# Patient Record
Sex: Female | Born: 1980 | Race: White | Hispanic: Yes | Marital: Married | Smoking: Never smoker
Health system: Southern US, Community
[De-identification: ages and names within clinical notes are randomized; demographics above are authoritative.]

## PROBLEM LIST (undated history)

## (undated) ENCOUNTER — Emergency Department (HOSPITAL_COMMUNITY): Discharge status: Absent without leave | Payer: Self-pay

## (undated) ENCOUNTER — Inpatient Hospital Stay (HOSPITAL_COMMUNITY): Payer: Self-pay

## (undated) DIAGNOSIS — L309 Dermatitis, unspecified: Secondary | ICD-10-CM

## (undated) DIAGNOSIS — K219 Gastro-esophageal reflux disease without esophagitis: Secondary | ICD-10-CM

## (undated) HISTORY — PX: NO PAST SURGERIES: SHX2092

## (undated) HISTORY — DX: Gastro-esophageal reflux disease without esophagitis: K21.9

---

## 2002-07-22 ENCOUNTER — Inpatient Hospital Stay (HOSPITAL_COMMUNITY): Admission: AD | Admit: 2002-07-22 | Discharge: 2002-07-24 | Payer: Self-pay | Admitting: Obstetrics & Gynecology

## 2003-03-01 ENCOUNTER — Emergency Department (HOSPITAL_COMMUNITY): Admission: EM | Admit: 2003-03-01 | Discharge: 2003-03-01 | Payer: Self-pay | Admitting: Emergency Medicine

## 2003-10-21 ENCOUNTER — Ambulatory Visit: Payer: Self-pay | Admitting: Family Medicine

## 2003-12-30 ENCOUNTER — Ambulatory Visit: Payer: Self-pay | Admitting: Family Medicine

## 2004-11-01 ENCOUNTER — Encounter (INDEPENDENT_AMBULATORY_CARE_PROVIDER_SITE_OTHER): Payer: Self-pay | Admitting: *Deleted

## 2004-11-01 LAB — CONVERTED CEMR LAB

## 2004-11-16 ENCOUNTER — Ambulatory Visit: Payer: Self-pay | Admitting: Family Medicine

## 2004-11-17 ENCOUNTER — Ambulatory Visit (HOSPITAL_COMMUNITY): Admission: RE | Admit: 2004-11-17 | Discharge: 2004-11-17 | Payer: Self-pay | Admitting: Family Medicine

## 2004-11-23 ENCOUNTER — Ambulatory Visit: Payer: Self-pay | Admitting: Family Medicine

## 2005-02-08 ENCOUNTER — Ambulatory Visit: Payer: Self-pay | Admitting: Family Medicine

## 2005-02-19 ENCOUNTER — Ambulatory Visit: Payer: Self-pay | Admitting: Family Medicine

## 2005-04-02 ENCOUNTER — Encounter (INDEPENDENT_AMBULATORY_CARE_PROVIDER_SITE_OTHER): Payer: Self-pay | Admitting: Specialist

## 2005-04-02 ENCOUNTER — Ambulatory Visit (HOSPITAL_BASED_OUTPATIENT_CLINIC_OR_DEPARTMENT_OTHER): Admission: RE | Admit: 2005-04-02 | Discharge: 2005-04-02 | Payer: Self-pay | Admitting: Otolaryngology

## 2005-08-02 ENCOUNTER — Ambulatory Visit: Payer: Self-pay | Admitting: Family Medicine

## 2005-09-06 ENCOUNTER — Ambulatory Visit: Payer: Self-pay | Admitting: Sports Medicine

## 2005-11-08 ENCOUNTER — Ambulatory Visit: Payer: Self-pay | Admitting: Family Medicine

## 2005-12-09 ENCOUNTER — Ambulatory Visit: Payer: Self-pay | Admitting: Family Medicine

## 2006-03-31 DIAGNOSIS — K649 Unspecified hemorrhoids: Secondary | ICD-10-CM | POA: Insufficient documentation

## 2006-04-01 ENCOUNTER — Encounter (INDEPENDENT_AMBULATORY_CARE_PROVIDER_SITE_OTHER): Payer: Self-pay | Admitting: *Deleted

## 2006-06-20 ENCOUNTER — Ambulatory Visit: Payer: Self-pay | Admitting: Family Medicine

## 2006-06-20 ENCOUNTER — Encounter: Payer: Self-pay | Admitting: Family Medicine

## 2006-06-20 LAB — CONVERTED CEMR LAB: Pap Smear: NORMAL

## 2006-06-23 ENCOUNTER — Encounter: Payer: Self-pay | Admitting: Family Medicine

## 2006-06-24 ENCOUNTER — Encounter: Payer: Self-pay | Admitting: Family Medicine

## 2006-09-05 ENCOUNTER — Ambulatory Visit: Payer: Self-pay | Admitting: Sports Medicine

## 2006-09-05 LAB — CONVERTED CEMR LAB: Beta hcg, urine, semiquantitative: POSITIVE

## 2006-09-20 ENCOUNTER — Telehealth: Payer: Self-pay | Admitting: *Deleted

## 2006-09-21 ENCOUNTER — Encounter (INDEPENDENT_AMBULATORY_CARE_PROVIDER_SITE_OTHER): Payer: Self-pay | Admitting: Family Medicine

## 2006-09-21 ENCOUNTER — Ambulatory Visit: Payer: Self-pay | Admitting: Family Medicine

## 2006-09-21 LAB — CONVERTED CEMR LAB
Antibody Screen: NEGATIVE
Basophils Absolute: 0 10*3/uL (ref 0.0–0.1)
Basophils Relative: 0 % (ref 0–1)
Bilirubin Urine: NEGATIVE
Blood in Urine, dipstick: NEGATIVE
Chlamydia, DNA Probe: NEGATIVE
Eosinophils Absolute: 0.1 10*3/uL (ref 0.0–0.7)
Eosinophils Relative: 1 % (ref 0–5)
GC Culture Only: NEGATIVE
GC Probe Amp, Genital: NEGATIVE
Glucose, Urine, Semiquant: NEGATIVE
HCT: 37.5 % (ref 36.0–46.0)
Hemoglobin: 13 g/dL (ref 12.0–15.0)
Hepatitis B Surface Ag: NEGATIVE
Ketones, urine, test strip: NEGATIVE
Lymphocytes Relative: 29 % (ref 12–46)
Lymphs Abs: 2.6 10*3/uL (ref 0.7–3.3)
MCHC: 34.7 g/dL (ref 30.0–36.0)
MCV: 85.4 fL (ref 78.0–100.0)
Monocytes Absolute: 0.4 10*3/uL (ref 0.2–0.7)
Monocytes Relative: 5 % (ref 3–11)
Neutro Abs: 5.9 10*3/uL (ref 1.7–7.7)
Neutrophils Relative %: 65 % (ref 43–77)
Nitrite: NEGATIVE
Platelets: 224 10*3/uL (ref 150–400)
Protein, U semiquant: NEGATIVE
RBC: 4.39 M/uL (ref 3.87–5.11)
RDW: 13 % (ref 11.5–14.0)
Rh Type: POSITIVE
Rubella antibody, serum, titer: IMMUNE
Rubella: 112.4 intl units/mL — ABNORMAL HIGH
Specific Gravity, Urine: 1.02
Urine culture (CF units/mL): NEGATIVE CFunits/mL
Urobilinogen, UA: 0.2
WBC: 9.1 10*3/uL (ref 4.0–10.5)
Whiff Test: NEGATIVE
pH: 8

## 2006-09-23 ENCOUNTER — Inpatient Hospital Stay (HOSPITAL_COMMUNITY): Admission: AD | Admit: 2006-09-23 | Discharge: 2006-09-23 | Payer: Self-pay | Admitting: Obstetrics & Gynecology

## 2006-09-26 ENCOUNTER — Ambulatory Visit: Payer: Self-pay | Admitting: Family Medicine

## 2006-10-11 ENCOUNTER — Ambulatory Visit: Payer: Self-pay | Admitting: Family Medicine

## 2006-10-11 ENCOUNTER — Encounter: Payer: Self-pay | Admitting: Family Medicine

## 2006-10-11 ENCOUNTER — Encounter (INDEPENDENT_AMBULATORY_CARE_PROVIDER_SITE_OTHER): Payer: Self-pay | Admitting: Family Medicine

## 2006-10-11 LAB — CONVERTED CEMR LAB
Bilirubin Urine: NEGATIVE
Glucose, Urine, Semiquant: NEGATIVE
KOH Prep: NEGATIVE
Ketones, urine, test strip: NEGATIVE
Nitrite: NEGATIVE
Protein, U semiquant: NEGATIVE
Specific Gravity, Urine: 1.02
Urobilinogen, UA: 0.2
WBC Urine, dipstick: NEGATIVE
Whiff Test: NEGATIVE
pH: 7

## 2006-10-12 ENCOUNTER — Encounter (INDEPENDENT_AMBULATORY_CARE_PROVIDER_SITE_OTHER): Payer: Self-pay | Admitting: Family Medicine

## 2006-11-09 ENCOUNTER — Ambulatory Visit: Payer: Self-pay | Admitting: Family Medicine

## 2006-11-09 LAB — CONVERTED CEMR LAB
Glucose, Urine, Semiquant: NEGATIVE
Protein, U semiquant: NEGATIVE

## 2006-12-14 ENCOUNTER — Ambulatory Visit (HOSPITAL_COMMUNITY): Admission: RE | Admit: 2006-12-14 | Discharge: 2006-12-14 | Payer: Self-pay | Admitting: Family Medicine

## 2006-12-15 ENCOUNTER — Ambulatory Visit: Payer: Self-pay | Admitting: Family Medicine

## 2006-12-15 LAB — CONVERTED CEMR LAB
Glucose, Urine, Semiquant: NEGATIVE
Protein, U semiquant: NEGATIVE
Whiff Test: NEGATIVE

## 2006-12-20 ENCOUNTER — Encounter (INDEPENDENT_AMBULATORY_CARE_PROVIDER_SITE_OTHER): Payer: Self-pay | Admitting: Family Medicine

## 2007-01-13 ENCOUNTER — Ambulatory Visit: Payer: Self-pay | Admitting: Family Medicine

## 2007-01-13 LAB — CONVERTED CEMR LAB
Glucose, Urine, Semiquant: NEGATIVE
Protein, U semiquant: NEGATIVE

## 2007-01-27 ENCOUNTER — Ambulatory Visit: Payer: Self-pay | Admitting: Family Medicine

## 2007-01-27 ENCOUNTER — Telehealth: Payer: Self-pay | Admitting: Family Medicine

## 2007-01-27 LAB — CONVERTED CEMR LAB
Glucose, Urine, Semiquant: NEGATIVE
Protein, U semiquant: NEGATIVE

## 2007-02-15 ENCOUNTER — Ambulatory Visit: Payer: Self-pay | Admitting: Family Medicine

## 2007-02-15 ENCOUNTER — Encounter: Payer: Self-pay | Admitting: *Deleted

## 2007-02-15 LAB — CONVERTED CEMR LAB
Glucose, Urine, Semiquant: NEGATIVE
Protein, U semiquant: NEGATIVE
Whiff Test: NEGATIVE

## 2007-02-16 ENCOUNTER — Encounter (INDEPENDENT_AMBULATORY_CARE_PROVIDER_SITE_OTHER): Payer: Self-pay | Admitting: *Deleted

## 2007-02-17 ENCOUNTER — Ambulatory Visit: Payer: Self-pay | Admitting: Family Medicine

## 2007-02-17 ENCOUNTER — Encounter (INDEPENDENT_AMBULATORY_CARE_PROVIDER_SITE_OTHER): Payer: Self-pay | Admitting: Family Medicine

## 2007-02-17 LAB — CONVERTED CEMR LAB
GTT, 1 hr: 109 mg/dL
GTT: NORMAL
Hemoglobin: 11.5 g/dL

## 2007-02-22 LAB — CONVERTED CEMR LAB

## 2007-02-27 ENCOUNTER — Ambulatory Visit: Payer: Self-pay | Admitting: Family Medicine

## 2007-02-27 LAB — CONVERTED CEMR LAB
Glucose, Urine, Semiquant: NEGATIVE
Protein, U semiquant: NEGATIVE

## 2007-03-17 ENCOUNTER — Ambulatory Visit: Payer: Self-pay | Admitting: Family Medicine

## 2007-03-17 LAB — CONVERTED CEMR LAB: Glucose, Urine, Semiquant: NEGATIVE

## 2007-03-31 ENCOUNTER — Ambulatory Visit: Payer: Self-pay | Admitting: Family Medicine

## 2007-03-31 LAB — CONVERTED CEMR LAB
Glucose, Urine, Semiquant: NEGATIVE
Protein, U semiquant: NEGATIVE

## 2007-04-17 ENCOUNTER — Encounter (INDEPENDENT_AMBULATORY_CARE_PROVIDER_SITE_OTHER): Payer: Self-pay | Admitting: Family Medicine

## 2007-04-17 ENCOUNTER — Ambulatory Visit: Payer: Self-pay | Admitting: Family Medicine

## 2007-04-17 LAB — CONVERTED CEMR LAB
Chlamydia, DNA Probe: NEGATIVE
GC Probe Amp, Genital: NEGATIVE
Glucose, Urine, Semiquant: NEGATIVE

## 2007-04-20 ENCOUNTER — Encounter: Payer: Self-pay | Admitting: *Deleted

## 2007-04-24 ENCOUNTER — Ambulatory Visit: Payer: Self-pay | Admitting: Sports Medicine

## 2007-04-24 LAB — CONVERTED CEMR LAB: Glucose, Urine, Semiquant: NEGATIVE

## 2007-05-02 ENCOUNTER — Ambulatory Visit: Payer: Self-pay | Admitting: Family Medicine

## 2007-05-02 ENCOUNTER — Encounter (INDEPENDENT_AMBULATORY_CARE_PROVIDER_SITE_OTHER): Payer: Self-pay | Admitting: Family Medicine

## 2007-05-02 LAB — CONVERTED CEMR LAB
ALT: 12 units/L (ref 0–35)
AST: 19 units/L (ref 0–37)
Albumin: 3.7 g/dL (ref 3.5–5.2)
Alkaline Phosphatase: 188 units/L — ABNORMAL HIGH (ref 39–117)
BUN: 8 mg/dL (ref 6–23)
Bilirubin Urine: NEGATIVE
CO2: 22 meq/L (ref 19–32)
Calcium: 9.3 mg/dL (ref 8.4–10.5)
Chloride: 103 meq/L (ref 96–112)
Creatinine, Ser: 0.47 mg/dL (ref 0.40–1.20)
Epithelial cells, urine: >20 /lpf
Glucose, Bld: 102 mg/dL — ABNORMAL HIGH (ref 70–99)
Glucose, Urine, Semiquant: NEGATIVE
HCT: 38.6 % (ref 36.0–46.0)
Hemoglobin: 13.3 g/dL (ref 12.0–15.0)
Ketones, urine, test strip: NEGATIVE
LDH: 147 units/L (ref 94–250)
MCHC: 34.5 g/dL (ref 30.0–36.0)
MCV: 88.7 fL (ref 78.0–100.0)
Nitrite: NEGATIVE
Platelets: 194 10*3/uL (ref 150–400)
Potassium: 4.2 meq/L (ref 3.5–5.3)
Protein, U semiquant: 100
RBC: 4.35 M/uL (ref 3.87–5.11)
RDW: 13.9 % (ref 11.5–15.5)
Sodium: 138 meq/L (ref 135–145)
Specific Gravity, Urine: 1.025
Total Bilirubin: 0.3 mg/dL (ref 0.3–1.2)
Total Protein: 6.7 g/dL (ref 6.0–8.3)
Uric Acid, Serum: 5.9 mg/dL (ref 2.4–7.0)
Urobilinogen, UA: 0.2
WBC Urine, dipstick: NEGATIVE
WBC: 7.8 10*3/uL (ref 4.0–10.5)
pH: 7

## 2007-05-03 ENCOUNTER — Ambulatory Visit: Payer: Self-pay | Admitting: Obstetrics and Gynecology

## 2007-05-03 ENCOUNTER — Encounter: Payer: Self-pay | Admitting: *Deleted

## 2007-05-03 ENCOUNTER — Inpatient Hospital Stay (HOSPITAL_COMMUNITY): Admission: AD | Admit: 2007-05-03 | Discharge: 2007-05-04 | Payer: Self-pay | Admitting: Obstetrics & Gynecology

## 2007-05-05 ENCOUNTER — Ambulatory Visit: Payer: Self-pay | Admitting: Family Medicine

## 2007-05-05 ENCOUNTER — Encounter (INDEPENDENT_AMBULATORY_CARE_PROVIDER_SITE_OTHER): Payer: Self-pay | Admitting: Family Medicine

## 2007-05-05 LAB — CONVERTED CEMR LAB: Protein, Ur: 616 mg/24hr — ABNORMAL HIGH (ref 50–100)

## 2007-05-08 ENCOUNTER — Ambulatory Visit: Payer: Self-pay | Admitting: Gynecology

## 2007-05-08 ENCOUNTER — Inpatient Hospital Stay (HOSPITAL_COMMUNITY): Admission: AD | Admit: 2007-05-08 | Discharge: 2007-05-10 | Payer: Self-pay | Admitting: Obstetrics and Gynecology

## 2007-05-08 ENCOUNTER — Encounter (INDEPENDENT_AMBULATORY_CARE_PROVIDER_SITE_OTHER): Payer: Self-pay | Admitting: Family Medicine

## 2007-05-15 ENCOUNTER — Encounter (INDEPENDENT_AMBULATORY_CARE_PROVIDER_SITE_OTHER): Payer: Self-pay | Admitting: Family Medicine

## 2007-05-15 ENCOUNTER — Encounter: Payer: Self-pay | Admitting: Family Medicine

## 2007-05-16 LAB — CONVERTED CEMR LAB
HCT: 38.4 % (ref 36.0–46.0)
Hemoglobin: 13.1 g/dL (ref 12.0–15.0)
MCHC: 34.1 g/dL (ref 30.0–36.0)
MCV: 88.9 fL (ref 78.0–100.0)
Platelets: 313 10*3/uL (ref 150–400)
RBC: 4.32 M/uL (ref 3.87–5.11)
RDW: 13.3 % (ref 11.5–15.5)
WBC: 7.8 10*3/uL (ref 4.0–10.5)

## 2007-05-29 ENCOUNTER — Encounter (INDEPENDENT_AMBULATORY_CARE_PROVIDER_SITE_OTHER): Payer: Self-pay | Admitting: Family Medicine

## 2007-05-30 ENCOUNTER — Ambulatory Visit: Payer: Self-pay | Admitting: Family Medicine

## 2007-06-22 ENCOUNTER — Ambulatory Visit: Payer: Self-pay | Admitting: Sports Medicine

## 2007-06-22 LAB — CONVERTED CEMR LAB
Bilirubin Urine: NEGATIVE
Glucose, Urine, Semiquant: NEGATIVE
Ketones, urine, test strip: NEGATIVE
Nitrite: NEGATIVE
Protein, U semiquant: 30
Specific Gravity, Urine: 1.02
Urobilinogen, UA: 1
WBC Urine, dipstick: NEGATIVE
pH: 7

## 2007-07-24 LAB — CONVERTED CEMR LAB: Pap Smear: NORMAL

## 2007-08-02 ENCOUNTER — Ambulatory Visit: Payer: Self-pay | Admitting: Family Medicine

## 2007-08-14 ENCOUNTER — Ambulatory Visit: Payer: Self-pay | Admitting: Family Medicine

## 2007-08-16 ENCOUNTER — Encounter: Payer: Self-pay | Admitting: Family Medicine

## 2007-08-16 LAB — CONVERTED CEMR LAB
Chlamydia, DNA Probe: NEGATIVE
GC Probe Amp, Genital: NEGATIVE

## 2007-10-02 ENCOUNTER — Ambulatory Visit: Payer: Self-pay | Admitting: Family Medicine

## 2007-10-10 ENCOUNTER — Ambulatory Visit: Payer: Self-pay | Admitting: Family Medicine

## 2007-10-10 ENCOUNTER — Encounter (INDEPENDENT_AMBULATORY_CARE_PROVIDER_SITE_OTHER): Payer: Self-pay | Admitting: *Deleted

## 2007-10-25 ENCOUNTER — Encounter (INDEPENDENT_AMBULATORY_CARE_PROVIDER_SITE_OTHER): Payer: Self-pay | Admitting: *Deleted

## 2007-11-06 ENCOUNTER — Ambulatory Visit: Payer: Self-pay | Admitting: Family Medicine

## 2007-11-06 LAB — CONVERTED CEMR LAB: Beta hcg, urine, semiquantitative: NEGATIVE

## 2007-11-20 ENCOUNTER — Ambulatory Visit: Payer: Self-pay | Admitting: Family Medicine

## 2007-11-20 LAB — CONVERTED CEMR LAB: Beta hcg, urine, semiquantitative: NEGATIVE

## 2007-11-28 ENCOUNTER — Ambulatory Visit: Payer: Self-pay | Admitting: Family Medicine

## 2008-01-22 ENCOUNTER — Ambulatory Visit: Payer: Self-pay | Admitting: Family Medicine

## 2008-02-26 ENCOUNTER — Ambulatory Visit: Payer: Self-pay | Admitting: Family Medicine

## 2008-04-22 ENCOUNTER — Ambulatory Visit: Payer: Self-pay | Admitting: Family Medicine

## 2008-05-30 ENCOUNTER — Encounter (INDEPENDENT_AMBULATORY_CARE_PROVIDER_SITE_OTHER): Payer: Self-pay | Admitting: Family Medicine

## 2008-05-30 ENCOUNTER — Ambulatory Visit: Payer: Self-pay | Admitting: Family Medicine

## 2008-05-30 LAB — CONVERTED CEMR LAB: Beta hcg, urine, semiquantitative: POSITIVE

## 2008-07-23 ENCOUNTER — Ambulatory Visit: Payer: Self-pay | Admitting: Family Medicine

## 2008-07-23 ENCOUNTER — Encounter: Payer: Self-pay | Admitting: Family Medicine

## 2008-07-23 LAB — CONVERTED CEMR LAB
Basophils Absolute: 0 10*3/uL (ref 0.0–0.1)
Basophils Relative: 0 % (ref 0–1)
Hemoglobin: 12.5 g/dL (ref 12.0–15.0)
Hepatitis B Surface Ag: NEGATIVE
Lymphocytes Relative: 25 % (ref 12–46)
Monocytes Absolute: 0.4 10*3/uL (ref 0.1–1.0)
Monocytes Relative: 5 % (ref 3–12)
Neutro Abs: 5.7 10*3/uL (ref 1.7–7.7)
Neutrophils Relative %: 69 % (ref 43–77)
RBC: 4.34 M/uL (ref 3.87–5.11)
RDW: 13.5 % (ref 11.5–15.5)
Rh Type: POSITIVE

## 2008-07-24 ENCOUNTER — Encounter: Payer: Self-pay | Admitting: Family Medicine

## 2008-07-30 ENCOUNTER — Ambulatory Visit: Payer: Self-pay | Admitting: Family Medicine

## 2008-07-30 ENCOUNTER — Encounter: Payer: Self-pay | Admitting: Family Medicine

## 2008-07-30 LAB — CONVERTED CEMR LAB
Chlamydia, DNA Probe: NEGATIVE
GC Probe Amp, Genital: NEGATIVE

## 2008-08-01 ENCOUNTER — Encounter: Payer: Self-pay | Admitting: Family Medicine

## 2008-08-01 ENCOUNTER — Ambulatory Visit (HOSPITAL_COMMUNITY): Admission: RE | Admit: 2008-08-01 | Discharge: 2008-08-01 | Payer: Self-pay | Admitting: *Deleted

## 2008-08-13 ENCOUNTER — Telehealth: Payer: Self-pay | Admitting: Family Medicine

## 2008-08-15 ENCOUNTER — Telehealth: Payer: Self-pay | Admitting: Family Medicine

## 2008-08-28 ENCOUNTER — Ambulatory Visit: Payer: Self-pay | Admitting: Family Medicine

## 2008-08-28 ENCOUNTER — Encounter: Payer: Self-pay | Admitting: Family Medicine

## 2008-08-28 DIAGNOSIS — R12 Heartburn: Secondary | ICD-10-CM | POA: Insufficient documentation

## 2008-08-28 LAB — CONVERTED CEMR LAB
Hemoglobin: 11.7 g/dL — ABNORMAL LOW (ref 12.0–15.0)
MCHC: 33.9 g/dL (ref 30.0–36.0)
RBC: 3.95 M/uL (ref 3.87–5.11)
TSH: 2.114 microintl units/mL (ref 0.350–4.500)
WBC: 8.6 10*3/uL (ref 4.0–10.5)

## 2008-08-29 ENCOUNTER — Telehealth: Payer: Self-pay | Admitting: *Deleted

## 2008-08-30 ENCOUNTER — Ambulatory Visit (HOSPITAL_COMMUNITY): Admission: RE | Admit: 2008-08-30 | Discharge: 2008-08-30 | Payer: Self-pay | Admitting: Family Medicine

## 2008-08-30 ENCOUNTER — Encounter: Payer: Self-pay | Admitting: Family Medicine

## 2008-09-21 ENCOUNTER — Inpatient Hospital Stay (HOSPITAL_COMMUNITY): Admission: AD | Admit: 2008-09-21 | Discharge: 2008-09-21 | Payer: Self-pay | Admitting: Obstetrics and Gynecology

## 2008-09-25 ENCOUNTER — Ambulatory Visit: Payer: Self-pay | Admitting: Family Medicine

## 2008-10-23 ENCOUNTER — Encounter: Payer: Self-pay | Admitting: Family Medicine

## 2008-10-23 ENCOUNTER — Ambulatory Visit: Payer: Self-pay | Admitting: Family Medicine

## 2008-10-23 LAB — CONVERTED CEMR LAB
HCT: 34.2 % — ABNORMAL LOW (ref 36.0–46.0)
Hemoglobin: 11.3 g/dL — ABNORMAL LOW (ref 12.0–15.0)
MCV: 88.4 fL (ref 78.0–100.0)
Platelets: 221 10*3/uL (ref 150–400)
RBC: 3.87 M/uL (ref 3.87–5.11)
WBC: 10.4 10*3/uL (ref 4.0–10.5)

## 2008-11-15 ENCOUNTER — Telehealth: Payer: Self-pay | Admitting: Family Medicine

## 2008-11-15 ENCOUNTER — Ambulatory Visit: Payer: Self-pay | Admitting: Family Medicine

## 2008-11-22 ENCOUNTER — Ambulatory Visit: Payer: Self-pay | Admitting: Family Medicine

## 2008-12-04 ENCOUNTER — Ambulatory Visit: Payer: Self-pay | Admitting: Family Medicine

## 2008-12-18 ENCOUNTER — Ambulatory Visit: Payer: Self-pay | Admitting: Family Medicine

## 2009-01-02 ENCOUNTER — Ambulatory Visit: Payer: Self-pay | Admitting: Family Medicine

## 2009-01-02 ENCOUNTER — Encounter: Payer: Self-pay | Admitting: Family Medicine

## 2009-01-02 LAB — CONVERTED CEMR LAB
Chlamydia, DNA Probe: NEGATIVE
GC Probe Amp, Genital: NEGATIVE

## 2009-01-09 ENCOUNTER — Ambulatory Visit: Payer: Self-pay | Admitting: Family Medicine

## 2009-01-15 IMAGING — US US OB COMP +14 WK
2 series · 14 of 28 positions shown · non-contrast
Comparison: none

OBSTETRICAL ULTRASOUND:

 This ultrasound exam was performed in the [HOSPITAL] Ultrasound Department.  The OB US report was generated in the AS system, and faxed to the ordering physician.  This report is also available in [REDACTED] PACS.

[Series 1: us ob comp +14 wk · 12 of 70 slices shown (1 of 2)]
[im 4/70]
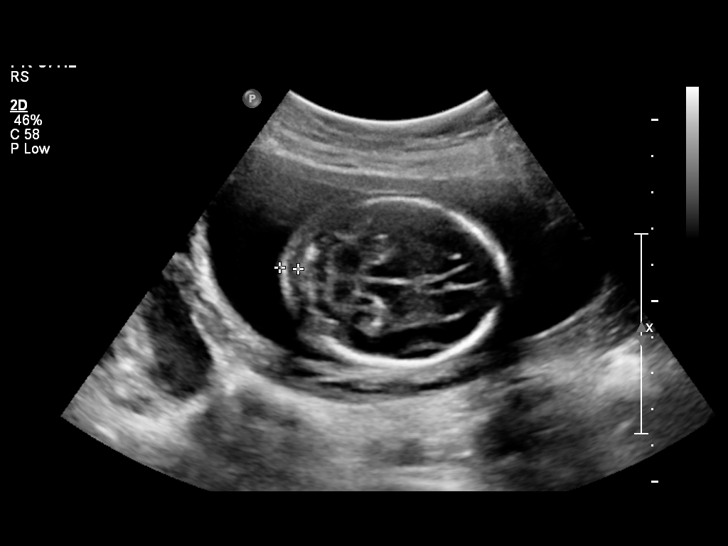
[im 10/70]
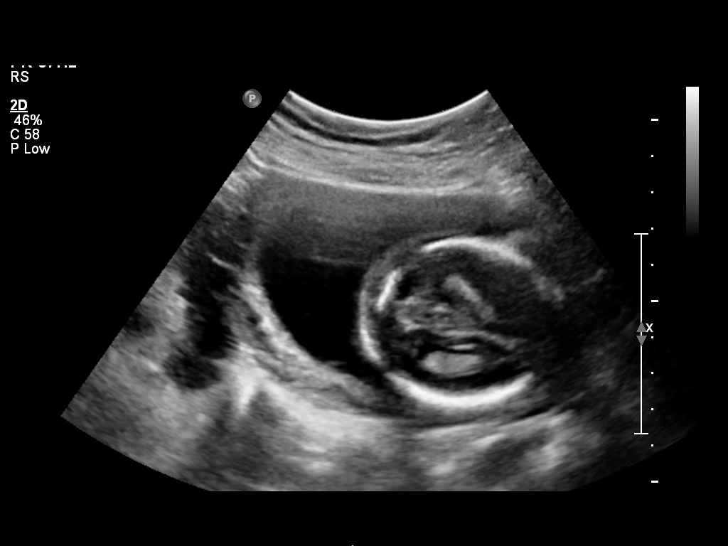
[im 16/70]
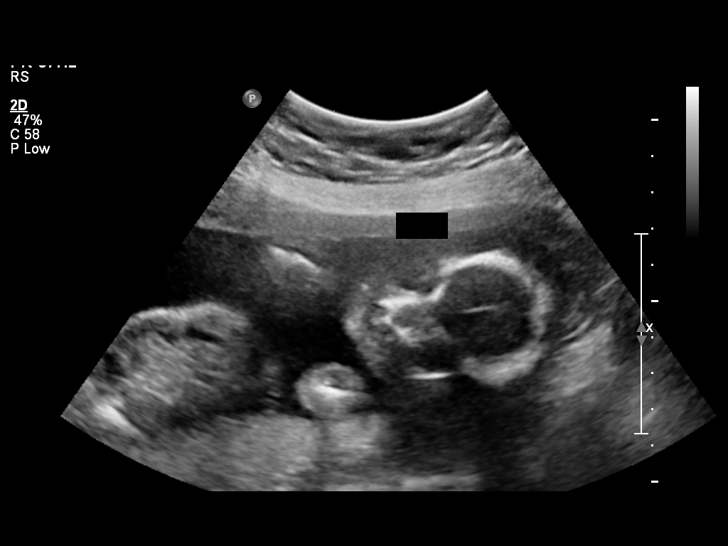
[im 22/70]
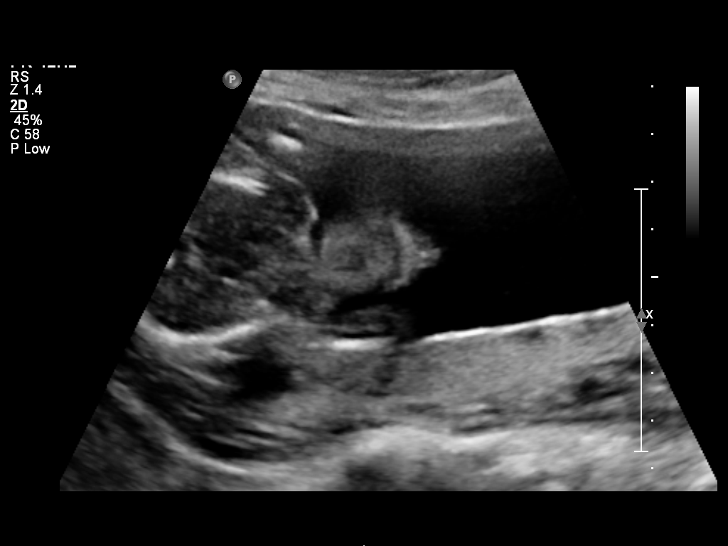
[im 28/70]
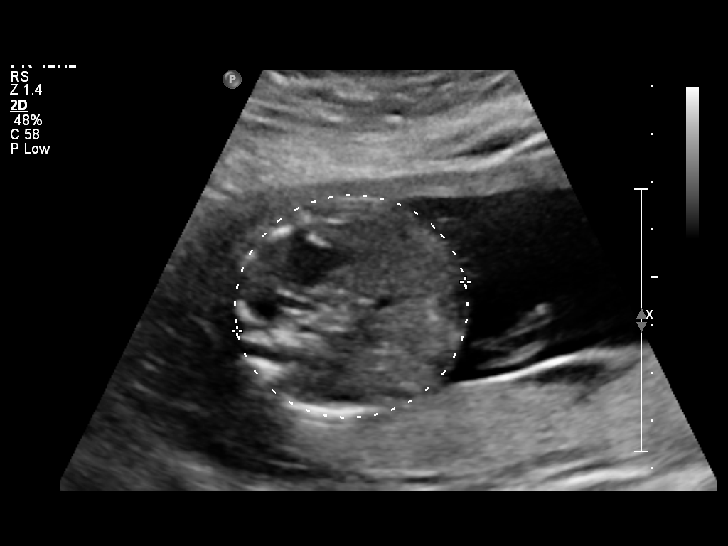
[im 34/70]
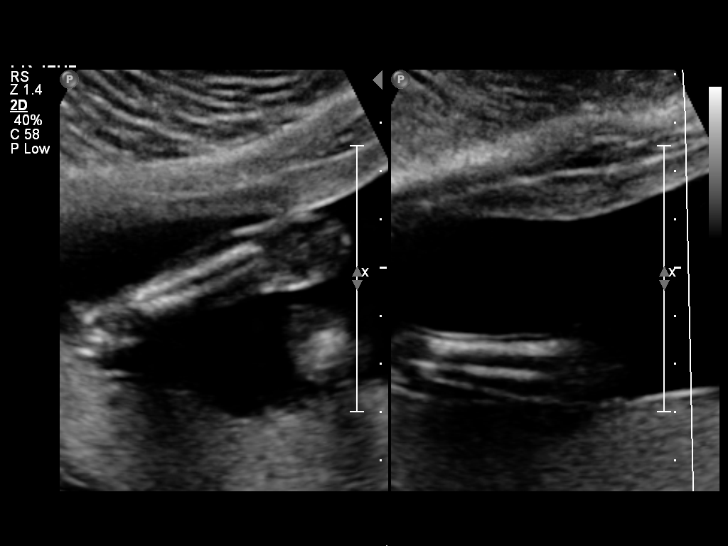
[im 40/70]
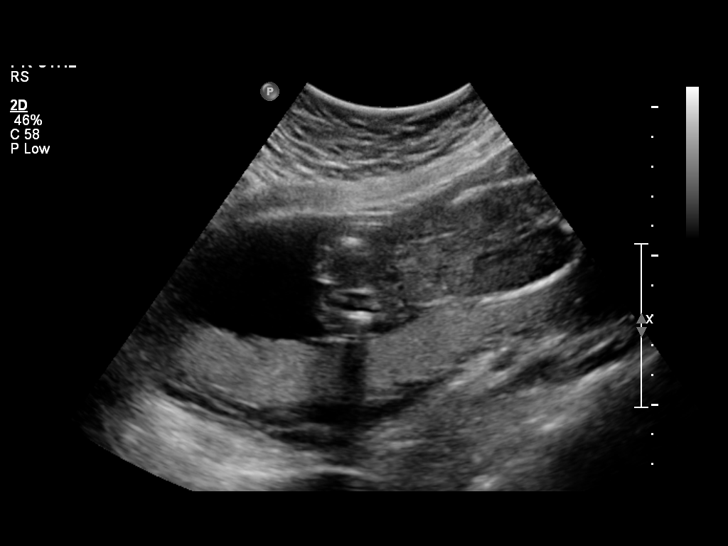
[im 46/70]
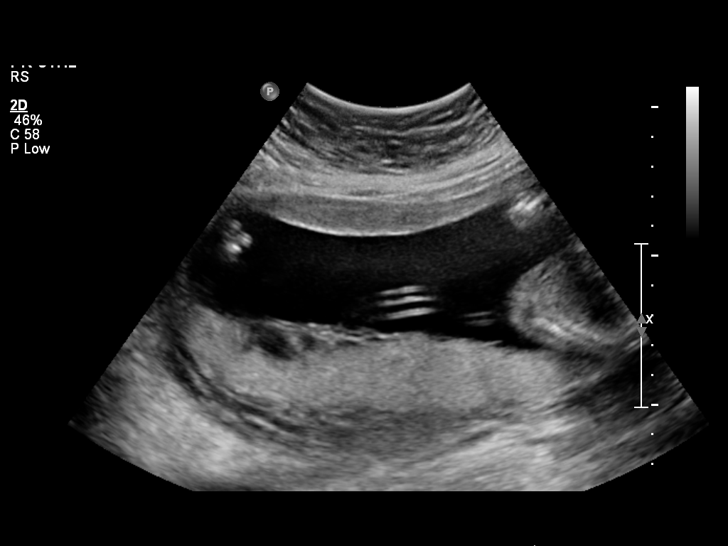
[im 52/70]
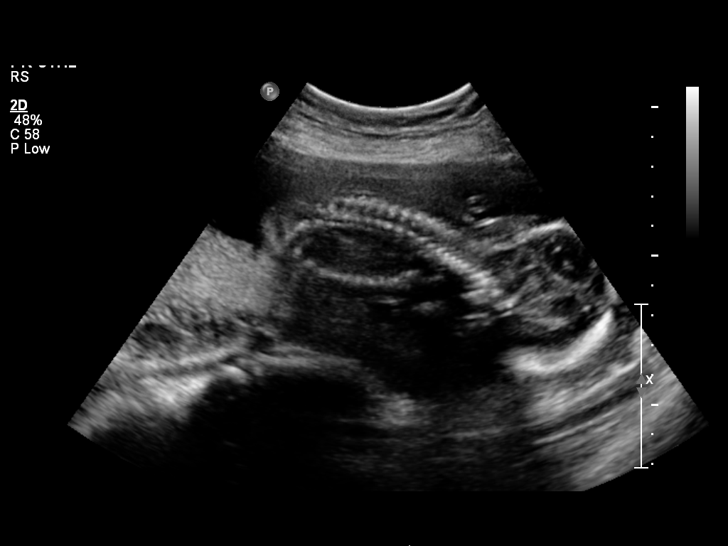
[im 58/70]
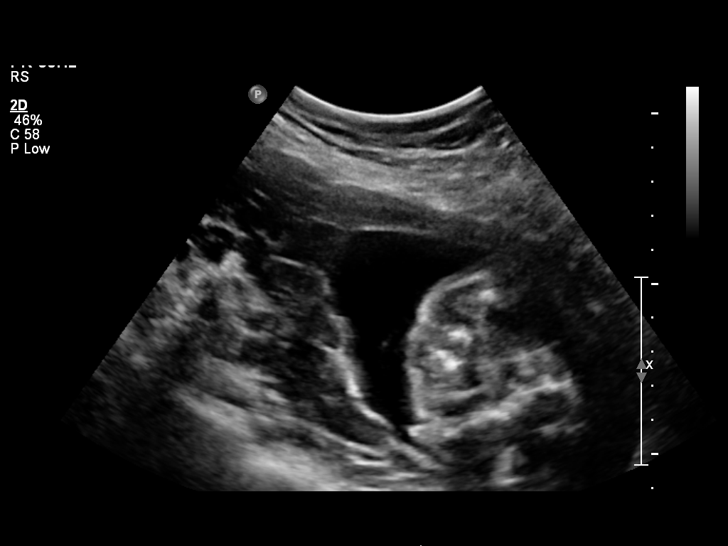
[im 64/70]
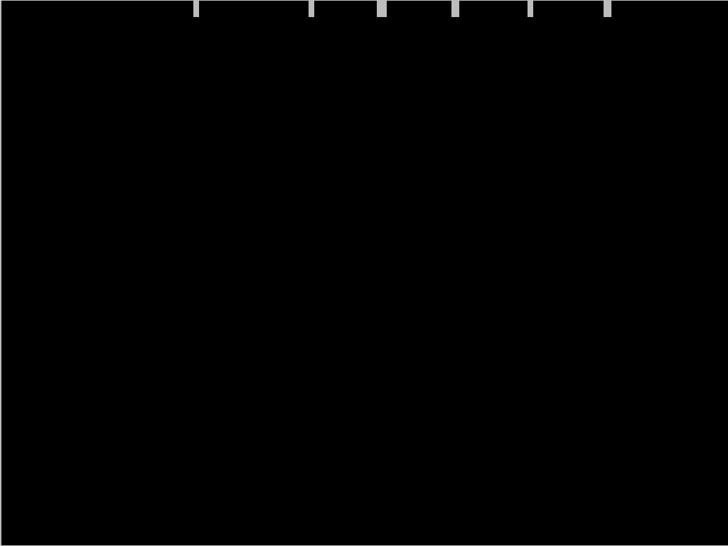
[im 70/70]
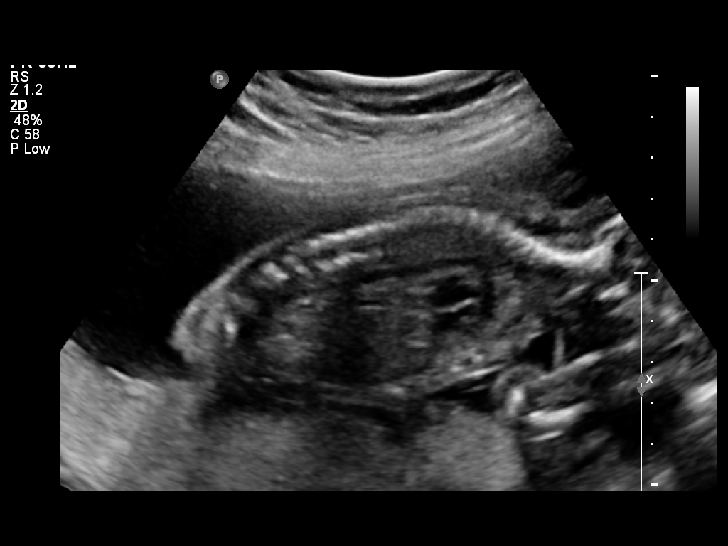

[Series 1: us ob comp +14 wk · 2 of 12 slices shown (2 of 2)]
[im 4/12]
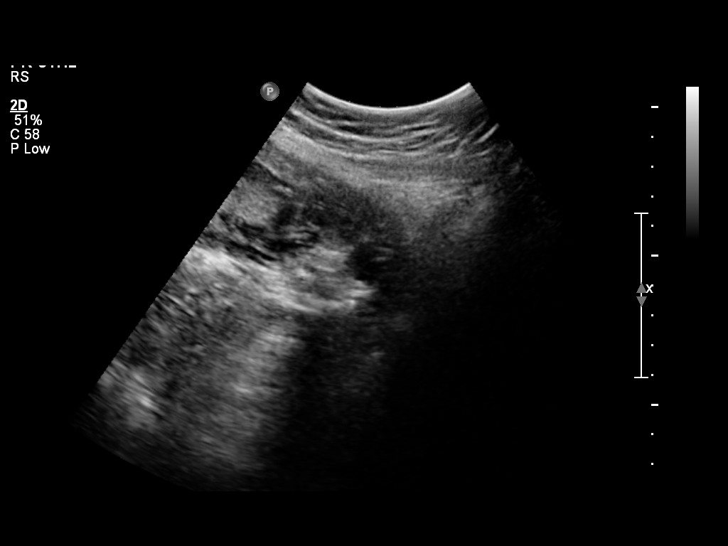
[im 12/12]
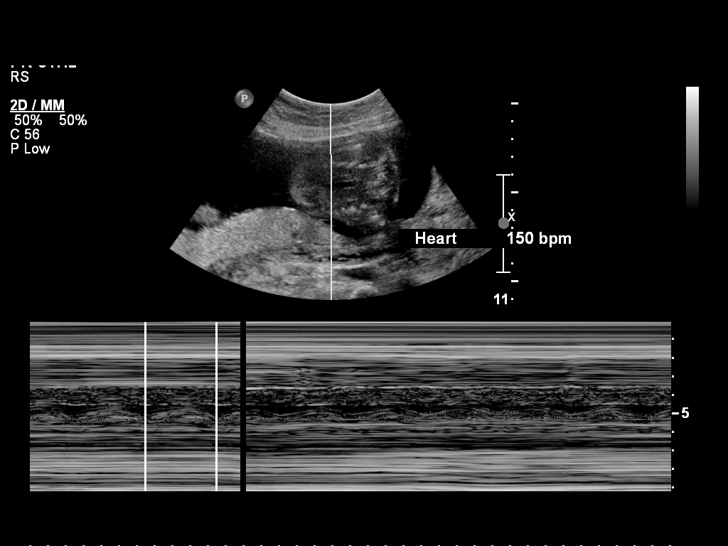

[14 of 28 positions shown; findings below may reference images not displayed]

IMPRESSION: See AS Obstetric US report.

## 2009-01-16 ENCOUNTER — Ambulatory Visit: Payer: Self-pay | Admitting: Family Medicine

## 2009-01-20 ENCOUNTER — Encounter: Payer: Self-pay | Admitting: Family Medicine

## 2009-01-21 ENCOUNTER — Inpatient Hospital Stay (HOSPITAL_COMMUNITY): Admission: AD | Admit: 2009-01-21 | Discharge: 2009-01-21 | Payer: Self-pay | Admitting: Obstetrics & Gynecology

## 2009-01-21 ENCOUNTER — Ambulatory Visit: Payer: Self-pay | Admitting: Advanced Practice Midwife

## 2009-01-22 ENCOUNTER — Ambulatory Visit: Payer: Self-pay | Admitting: Family Medicine

## 2009-01-22 ENCOUNTER — Encounter: Payer: Self-pay | Admitting: Family Medicine

## 2009-01-22 LAB — CONVERTED CEMR LAB
AST: 19 units/L (ref 0–37)
Albumin: 3.3 g/dL — ABNORMAL LOW (ref 3.5–5.2)
BUN: 11 mg/dL (ref 6–23)
CO2: 18 meq/L — ABNORMAL LOW (ref 19–32)
Calcium: 9.2 mg/dL (ref 8.4–10.5)
Chloride: 105 meq/L (ref 96–112)
Creatinine, Ser: 0.44 mg/dL (ref 0.40–1.20)
HCT: 34.6 % — ABNORMAL LOW (ref 36.0–46.0)
Hemoglobin: 12.2 g/dL (ref 12.0–15.0)
Potassium: 3.5 meq/L (ref 3.5–5.3)
RBC: 4.06 M/uL (ref 3.87–5.11)
RDW: 14 % (ref 11.5–15.5)
WBC: 6.8 10*3/uL (ref 4.0–10.5)

## 2009-01-27 ENCOUNTER — Inpatient Hospital Stay (HOSPITAL_COMMUNITY): Admission: AD | Admit: 2009-01-27 | Discharge: 2009-01-29 | Payer: Self-pay | Admitting: Obstetrics & Gynecology

## 2009-01-27 ENCOUNTER — Ambulatory Visit: Payer: Self-pay | Admitting: Obstetrics & Gynecology

## 2009-01-28 ENCOUNTER — Ambulatory Visit: Payer: Self-pay | Admitting: Family Medicine

## 2009-03-08 ENCOUNTER — Emergency Department (HOSPITAL_COMMUNITY): Admission: EM | Admit: 2009-03-08 | Discharge: 2009-03-08 | Payer: Self-pay | Admitting: Emergency Medicine

## 2009-03-10 ENCOUNTER — Ambulatory Visit: Payer: Self-pay | Admitting: Family Medicine

## 2009-03-10 DIAGNOSIS — N63 Unspecified lump in unspecified breast: Secondary | ICD-10-CM | POA: Insufficient documentation

## 2009-03-12 ENCOUNTER — Encounter: Admission: RE | Admit: 2009-03-12 | Discharge: 2009-03-12 | Payer: Self-pay | Admitting: Family Medicine

## 2009-03-28 ENCOUNTER — Ambulatory Visit: Payer: Self-pay | Admitting: Family Medicine

## 2009-03-28 ENCOUNTER — Encounter: Payer: Self-pay | Admitting: Sports Medicine

## 2009-03-28 ENCOUNTER — Encounter: Payer: Self-pay | Admitting: Family Medicine

## 2009-03-28 LAB — CONVERTED CEMR LAB: Beta hcg, urine, semiquantitative: NEGATIVE

## 2009-04-03 ENCOUNTER — Ambulatory Visit: Payer: Self-pay | Admitting: Family Medicine

## 2009-08-22 ENCOUNTER — Encounter: Payer: Self-pay | Admitting: Family Medicine

## 2009-08-22 DIAGNOSIS — F341 Dysthymic disorder: Secondary | ICD-10-CM | POA: Insufficient documentation

## 2009-08-22 DIAGNOSIS — R6882 Decreased libido: Secondary | ICD-10-CM | POA: Insufficient documentation

## 2009-09-02 ENCOUNTER — Ambulatory Visit: Payer: Self-pay | Admitting: Family Medicine

## 2009-12-19 ENCOUNTER — Ambulatory Visit: Payer: Self-pay | Admitting: Family Medicine

## 2010-02-09 ENCOUNTER — Ambulatory Visit: Admit: 2010-02-09 | Payer: Self-pay

## 2010-03-04 NOTE — Miscellaneous (Signed)
Summary: Consent for IUD  Consent for IUD   Imported By: Knox Royalty 07/10/2009 10:18:08  _____________________________________________________________________  External Attachment:    Type:   Image     Comment:   External Document

## 2010-03-04 NOTE — Assessment & Plan Note (Signed)
Summary: ?DEPRESSION/[facility]   Vital Signs:  Patient profile:   30 year old female Height:      61 inches Weight:      138 pounds BMI:     26.17 Temp:     98.1 degrees F oral Pulse rate:   63 / minute BP sitting:   117 / 69  (right arm) Cuff size:   regular  Vitals Entered By: Tessie Fass CMA (September 02, 2009 11:13 AM) CC: depression ?   Primary Care Provider:  [name] MD  CC:  depression ?Marland Kitchen  History of Present Illness: Visit conducted in [place].   Vernetta presents today accompanied by her infant son, complains of feeling very down/depressed in the past week, anhedonia, and increased crying spells.  She has been diagnosed with depression in the past, had been on citalopram but only took for 5 days at that time before deciding to go off the med.  She reports that she usually has short intervals of feeling depressed (lasts for a day or so), but then the depression lifts and she feels well.  This past week has been different; more prolonged, and has had trouble putting on a good face for her family.  Is close with her mother-in-law, who, along with her husband, has chided [name] for taking the citalopram, saying she was 'crazy'.   Kaiah is agreeable to the idea of treatment and would like to consider all treatment options.   She is breastfeeding her son as well.  Drinks about 5 small bottles (less than 12oz bottles) a week, usually in one evening per week.  No drugs.  No seizure activity.  Nonsmoker.   In addition to low energy, has sleep difficulties.  Her son sleeps in the early evening, then awakens around 10pm and stays awake until 2am.  Fedra goes to sleep at 2am, then gets up at [facility].  She struggles for around 1hr (from 2-3am) to fall asleep.  Does not nap.  Co-sleeping. Discussed sleep hygiene.  Has Mirena placed; lives with husband and her children.  Feels safe, denies any abuse of any kind.  Denies SI.   A Spanish-language PHQ9 is administered today, she scores 18 (1 point  higher than most recent one): 3 points for #2,3,4,5; 2 pts for #1,6; 1 pt for #7,8; zero pts for #9).   Allergies: No Known Drug Allergies  Physical Exam  General:  Alert, appropriate, not emotionally labile when discussing depressive symptoms.  No apparent distress. Eyes:  no proptosis.  EOMI. PERRL Mouth:  moist mucus membranes Neck:  supple, no thyroid nodules or masses, no adenopathy   Impression & Recommendations:  Problem # 1:  DYSTHYMIC DISORDER (ICD-300.4)  Patient with depression, previously on SSRI for this (although never took for more than 5 days).  She is concerned about lack of libido, and she would like to consider med options that have fewer sexual side effects.  She has never been on bupropion, has no sz history, and does not drink more than the 5 beers reported.  She is breastfeeding.  Both SSRI and bupropion can be secreted in breastmilk, risk/benefits discussed.  When considering the risks of untreated depression in this patient, I believe that initiating pharmacotherapy is an appropriate action.   Also gave contact info for Bonne Dolores at the [facility], for additional resources.   Discussed fully in Spanish with patient.  She agrees to a 2-3 week followup with me in the office, or sooner  if needed.   Orders: [facility]- Est Level  3 ([address]109)  Complete Medication List: 1)  Bupropion Hcl 150 Mg Xr12h-tab (Bupropion hcl) .... Sig: take 1 tab by mouth one time daily for 1 week, then 1 tab two times a day spanish language instructions  Patient Instructions: 1)  Fue un placer verle hoy.   2)  Creo que la raiz del problema es la depresion.  La depresion es bastante comun, afecta a aproximadamente 10% de la poblacion.  3)  Le estoy recetando una medicina que se llama de Bupropion SR 150mg .  Tome una tableta por la [place], por una semana.  Despues, aumente a 1 tableta dos veces por dia.  4)  Esta medicina no es [place]; podemos hablar de la  posibilidad de retirarla en el futuro.  Por ahora, hay que tomarla todos los [place], [place] se Therapist, occupational.  5)  No debe tomar alcohol con esta medicina.  6)  Lleve la receta, junto con la tarjeta anaranjada de [employer], a la [address] del [place] de [place], [address], [place].  Tel. [phone].   7)  Quiero verle de nuevo en 3 a 4 semanas.  8)  Puede contactar a Bonne Dolores, en [facility] [place] 9)  [address], [address])  [place], [place] [address] 11)  Phone: [phone]  12)  Fax: [phone] 13)  FOLLOW UP WITH DR Mauricio Po 3  to 4 WEEKS Prescriptions: BUPROPION HCL 150 MG XR12H-TAB (BUPROPION HCL) SIG: Take 1 tab by mouth one time daily for 1 week, then 1 tab two times a day Spanish language instructions  #60 x 0   Entered and Authorized by:   [name] MD   Signed by:   [name] MD on 09/02/2009   Method used:   Print then Give to Patient   RxID:   [id number]

## 2010-03-04 NOTE — Miscellaneous (Signed)
   Clinical Lists Changes  Problems: Added new problem of DYSTHYMIC DISORDER (ICD-300.4) Added new problem of LIBIDO, DECREASED (ICD-799.81) Assessed LIBIDO, DECREASED as comment only -  Likely related to depressive disorder.  For her own appt to address more completely. She appears safe for more detailed followup in future visit.   Orders: [facility]- Est Level  2 (16109)  Orders: Added new Test order of [facility]- Est Level  2 (60454) - Signed    Interview in Spanish.   Beth Brown comes to clinic today for son [name]  She admits to feeling blues since he was one 41 old (now 109 months old).  No libido, creating problems wit her husband. Similar spells in the past.  No SI or HI, no abuse.  Feels safe.  Is breastfeeding.  She completes Spanish-language PHQ9 and gets score of 17 (3 pts for #3-5; 2 pts for #1,2; 1 pt for #3,6,7,8.  No points for #9).   Plan to see [name] for her own visit to discuss history and PHQ9 consistent wiht depression history.   She agrees and denies SI. [name] MD  August 22, 2009 7:42 PM   Impression & Recommendations:  Problem # 1:  LIBIDO, DECREASED (ICD-799.81)  Likely related to depressive disorder.  For her own appt to address more completely. She appears safe for more detailed followup in future visit.   Orders: [facility]- Est Level  2 (09811)  Complete Medication List: 1)  Hydrocortisone 2.5 % Crea (Hydrocortisone) .... Apply to affected area two times a day for 10 days 40 g tube 2)  Vistaril 25 Mg Caps (Hydroxyzine pamoate) .Marland Kitchen.. 1 tab by mouth every 6 hour for itchiness 3)  Triamcinolone Acetonide 0.5 % Crea (Triamcinolone acetonide) .... Apply to affected area two times a day to three times a day. dispense 1 tube 4)  Vicodin 5-500 Mg Tabs (Hydrocodone-acetaminophen) .... One tab by mouth q6h as needed for pain.

## 2010-03-04 NOTE — Assessment & Plan Note (Signed)
Summary: 6 WK PP/KH   Vital Signs:  Patient profile:   30 year old female Height:      61 inches Weight:      143 pounds BMI:     27.12 Temp:     98.0 degrees F oral Pulse rate:   64 / minute BP sitting:   115 / 68  (left arm) Cuff size:   regular  Vitals Entered By: Tessie Fass CMA (March 10, 2009 2:32 PM) CC: 6 wk pp check Is Patient Diabetic? No Pain Assessment Patient in pain? no        Primary Care Provider:  [name] MD  CC:  6 wk pp check.  History of Present Illness: cc: 6 wk pp exam  30 y/o G3P3003 presents for 6 wk pp.  No complications with delivery, NSVD at 39 6/7.  Not getting much sleep.  Denies depressive symptoms, denies SI/HI.  Birth control: depo provera at discharge from hospital.  Would like Mirena.   Breast feeding.  Some redness to right breast x 3 days.  Denies trauma to breast.  Menses: bleeding stopped 2 days ago.  C/o mid-low back pain since labor.  Pain worse when she stands straight up, no pain with bending.  Taking tylenol extra strength with some relief (4 tabs per day).      Habits & Providers  Alcohol-Tobacco-Diet     Tobacco Status: never  Allergies: No Known Drug Allergies  Family History: hypertension - Mother No family history of DM, Thyroid disease, cancer, heart disease, lung problems, depression, or drug/alcohol abuse No Breast CA  Review of Systems       per hpi  Physical Exam  General:  Well-developed,well-nourished,in no acute distress; alert,appropriate and cooperative throughout examination. vitals reviewed. Breasts:  Breasts are equal in size.  Right breast mildly red in upper-outer quadrant.  no warmth.  no tenderness.  Both breasts overall nodular,  There is a 1cm hard nodule at the 10 o'clock position, mobile.   Genitalia:  Normal introitus for age, no external lesions, normal uterus size and position, no adnexal masses or tenderness.  Pap not done as pap in 07/2008 was negative.  Cervical Nodes:  No  lymphadenopathy noted Axillary Nodes:  No palpable lymphadenopathy   Impression & Recommendations:  Problem # 1:  SUPERVISION, NORMAL PREGNANCY, MULTIPAROUS (ICD-V22.1) Assessment Unchanged Post partum exam with no red flags.  No s/sx of pp depression.  Pelvic exam with firm cervix.  Pt would like Mirena for birth control.  She can rtc in 2 wks for IUD insertion.  For back pain that was present with labor, advised tylenol three times a day. [facility]  Problem # 2:  BREAST MASS, RIGHT (ICD-611.72) Assessment: New New breast mass in a young pt with no family history of breast cancer.  Most likely benign process, but will l get U/S as pt is breast feeding.   Redness on breast does not appear to be mastitis.  Since redness is getting better, will continue to monitor.   Orders[facility]ound (Ultrasound) [facility]  Complete Medication List: 1)  Hydrocortisone 2.5 % Crea (Hydrocortisone) .... Apply to affected area two times a day for 10 days 40 g tube 2)  Vistaril 25 Mg Caps (Hydroxyzine pamoate) .Marland Kitchen.. 1 tab by mouth every 6 hour for itchiness 3)  Triamcinolone Acetonide 0.5 % Crea (Triamcinolone acetonide) .... Apply to affected area two times a day to three times a day. dispense  1 tube  Patient Instructions: 1)  Please schedule a follow-up appointment in 2 weeks for IUD.  2)  For back pain you can take tylenol 1000mg  three times a day. 3)  I would like to get an ultrasound for your breast pain.   4)  Please come back if breast pain is worse.

## 2010-03-04 NOTE — Assessment & Plan Note (Signed)
Summary: IUD/KH   Vital Signs:  Patient profile:   30 year old female Weight:      144.9 pounds Temp:     98.6 degrees F rectal Pulse rate:   97 / minute BP sitting:   122 / 75  Vitals Entered By: Loralee Pacas CMA (March 28, 2009 4:40 PM)  Primary Care Provider:  [name] MD   History of Present Illness: 30 y/o G3P3003 2 months pp.  No complications with delivery, NSVD at 39 6/7.   Here for IUD placement.  Current Medications (verified): 1)  Hydrocortisone 2.5 % Crea (Hydrocortisone) .... Apply To Affected Area Two Times A Day For 10 Days 40 G Tube 2)  Vistaril 25 Mg Caps (Hydroxyzine Pamoate) .Marland Kitchen.. 1 Tab By Mouth Every 6 Hour For Itchiness 3)  Triamcinolone Acetonide 0.5 % Crea (Triamcinolone Acetonide) .... Apply To Affected Area Two Times A Day To Three Times A Day. Dispense 1 Tube 4)  Vicodin 5-500 Mg Tabs (Hydrocodone-Acetaminophen) .... One Tab By Mouth Q6h As Needed For Pain.  Allergies (verified): No Known Drug Allergies  Past History:  Past Medical History: G3P3003 - NSVD x 3 TSH normal - 12/01/2004 ? PP Anxiety / Depression after first child  Past Surgical History: I&D lower lip mucocele - 02/10/2005 IUD Merena - 07/03/2003 placed, removed Jun 20, 2006 IUD Mirena placed 03/28/09>>  Review of Systems       See HPI  Physical Exam  Genitalia:  Normal introitus for age, no external lesions, no vaginal discharge, mucosa pink and moist, no vaginal or cervical lesions, no vaginal atrophy, no friaility or hemorrhage, normal uterus size and position, no adnexal masses or tenderness Additional Exam:  IUD placement Procedure, risks, benefits explained through interpreter.   Upreg confirmed neg Time out conducted.  Speculum placed, Betadine used to clean cervix, vagina.    Sterile procedures then used. Sterile gloves donned, uterus sounded to 8cm, tenaculum placed into anterior cervix, mirena taken out, tested, adjusted to 8cm, placed into OS in a sterile  fashion without touching walls, deployed, strings cut at about 2 inches.  Tenaculum removed and some bleeding noted, Monsel's used with pressure and hemostasis achieved.  Speculum removed, pt stable.    Pt endorsed some sensation of strings, declined offer to shorten strings now.  Would like to come back in one week for IUD check and for shortening of strings then.  Instructed on red flags.    Impression & Recommendations:  Problem # 1:  CONTRACEPTIVE MANAGEMENT (ICD-V25.09) Assessment Unchanged IUD placed. Vicodin as needed pain.  Orders: U Preg-[facility] ([address]109) IUD insert- [facility] (60454)  Complete Medication List: 1)  Hydrocortisone 2.5 % Crea (Hydrocortisone) .... Apply to affected area two times a day for 10 days 40 g tube 2)  Vistaril 25 Mg Caps (Hydroxyzine pamoate) .Marland Kitchen.. 1 tab by mouth every 6 hour for itchiness 3)  Triamcinolone Acetonide 0.5 % Crea (Triamcinolone acetonide) .... Apply to affected area two times a day to three times a day. dispense 1 tube 4)  Vicodin 5-500 Mg Tabs (Hydrocodone-acetaminophen) .... One tab by mouth q6h as needed for pain.  Patient Instructions: 1)  Come back to see me in one week to check the IUD placement and strings. 2)  See handout. 3)  Come back if you have fevers/chills, persistent abdominal pain. 4)  -Dr. Karie Schwalbe. Prescriptions: VICODIN 5-500 MG TABS (HYDROCODONE-ACETAMINOPHEN) One tab by mouth q6h as needed for pain.  #20 x 0   Entered and Authorized by:  Rodney Langton MD   Signed by:   Rodney Langton MD on 03/28/2009   Method used:   Print then Give to Patient   RxID:   [id number]   Laboratory Results   Urine Tests  Date/Time Received: March 28, 2009 2:40 PM  Date/Time Reported: March 28, 2009 2:44 PM     Urine HCG: negative Comments: ...........test performed by...........Marland KitchenTerese Door, CMA

## 2010-03-04 NOTE — Assessment & Plan Note (Signed)
Summary: FU/KH   Vital Signs:  Patient profile:   30 year old female Weight:      145.5 pounds Temp:     98.2 degrees F Pulse rate:   74 / minute BP sitting:   123 / 76  Vitals Entered By: Loralee Pacas CMA (April 03, 2009 3:02 PM) 2  Primary Care Provider:  [name] MD   History of Present Illness: 60F, IUD placement at last visit.  Doing well, no complaints, no longer feels strings.  Minimal bleeding that is better than before, no cramping, no fevers.  Interpreter assisted in interview.  Current Medications (verified): 1)  Hydrocortisone 2.5 % Crea (Hydrocortisone) .... Apply To Affected Area Two Times A Day For 10 Days 40 G Tube 2)  Vistaril 25 Mg Caps (Hydroxyzine Pamoate) .Marland Kitchen.. 1 Tab By Mouth Every 6 Hour For Itchiness 3)  Triamcinolone Acetonide 0.5 % Crea (Triamcinolone Acetonide) .... Apply To Affected Area Two Times A Day To Three Times A Day. Dispense 1 Tube 4)  Vicodin 5-500 Mg Tabs (Hydrocodone-Acetaminophen) .... One Tab By Mouth Q6h As Needed For Pain.  Allergies (verified): No Known Drug Allergies  Review of Systems       See HPI  Physical Exam  General:  Well-developed,well-nourished,in no acute distress; alert,appropriate and cooperative throughout examination Genitalia:  Normal introitus for age, no external lesions, no vaginal discharge, mucosa pink and moist, no vaginal or cervical lesions, no vaginal atrophy, no friaility or hemorrhage, normal uterus size and position, no adnexal masses or tenderness.  IUD strings seen protruding from os.  grasped with ring forceps, shortened to approx 1.5 inches from os.     Impression & Recommendations:  Problem # 1:  CONTRACEPTIVE MANAGEMENT (ICD-V25.09) Assessment Unchanged IUD stable, ok for 5 years.  Pt to fu in 1 year with PCP Dr. Mauricio Po for well woman exam.  Orders: [facility]- Est Level  3 ([address])  Complete Medication List: 1)  Hydrocortisone 2.5 % Crea (Hydrocortisone) .... Apply to affected area two times  a day for 10 days 40 g tube 2)  Vistaril 25 Mg Caps (Hydroxyzine pamoate) .Marland Kitchen.. 1 tab by mouth every 6 hour for itchiness 3)  Triamcinolone Acetonide 0.5 % Crea (Triamcinolone acetonide) .... Apply to affected area two times a day to three times a day. dispense 1 tube 4)  Vicodin 5-500 Mg Tabs (Hydrocodone-acetaminophen) .... One tab by mouth q6h as needed for pain.  Patient Instructions: 1)  Good to see you, 2)  I shortened the strings a little.  The mirena is good for 5 years. 3)  Come back to see Dr. Mauricio Po in 1 year for a well woman checkup. 4)  -Dr. Karie Schwalbe.

## 2010-03-04 NOTE — Assessment & Plan Note (Signed)
Summary: FLU SHOT/KH   Nurse Visit   Vital Signs:  Patient profile:   30 year old female Temp:     98.4 degrees F  Vitals Entered By: Theresia Lo RN (December 19, 2009 4:46 PM)  Allergies: No Known Drug Allergies  Immunizations Administered:  Influenza Vaccine # 1:    Vaccine Type: Fluvax 3+    Site: right deltoid    Mfr: GlaxoSmithKline    Dose: 0.5 ml    Route: IM    Given by: Theresia Lo RN    Exp. Date: 07/22/2010    Lot #: [id number]    VIS given: 08/26/09 version given December 19, 2009.  Flu Vaccine Consent Questions:    Do you have a history of severe allergic reactions to this vaccine? no    Any prior history of allergic reactions to egg and/or gelatin? no    Do you have a sensitivity to the preservative Thimersol? no    Do you have a past history of Guillan-Barre Syndrome? no    Do you currently have an acute febrile illness? no    Have you ever had a severe reaction to latex? no    Vaccine information given and explained to patient? yes    Are you currently pregnant? no  Orders Added: 1)  Flu Vaccine 69yrs + [90658] 2)  Admin 1st Vaccine [90471]      Vital Signs:  Patient profile:   30 year old female Temp:     98.4 degrees F  Vitals Entered By: Theresia Lo RN (December 19, 2009 4:46 PM)

## 2010-03-04 NOTE — Letter (Signed)
Summary: Handout Printed  Printed Handout:  - Contraceptive Devices (IUD's) 

## 2010-03-27 ENCOUNTER — Encounter: Payer: Self-pay | Admitting: *Deleted

## 2010-04-10 ENCOUNTER — Encounter: Payer: Self-pay | Admitting: Family Medicine

## 2010-04-22 LAB — POCT URINALYSIS DIP (DEVICE)
Glucose, UA: NEGATIVE mg/dL
Protein, ur: 30 mg/dL — AB

## 2010-04-22 LAB — POCT PREGNANCY, URINE: Preg Test, Ur: NEGATIVE

## 2010-05-04 LAB — CBC
HCT: 29.4 % — ABNORMAL LOW (ref 36.0–46.0)
Hemoglobin: 10 g/dL — ABNORMAL LOW (ref 12.0–15.0)
Hemoglobin: 12.1 g/dL (ref 12.0–15.0)
MCV: 89.8 fL (ref 78.0–100.0)
RBC: 3.3 MIL/uL — ABNORMAL LOW (ref 3.87–5.11)
RBC: 3.48 MIL/uL — ABNORMAL LOW (ref 3.87–5.11)
RDW: 14.1 % (ref 11.5–15.5)
RDW: 14.6 % (ref 11.5–15.5)
WBC: 12.7 10*3/uL — ABNORMAL HIGH (ref 4.0–10.5)
WBC: 8.6 10*3/uL (ref 4.0–10.5)

## 2010-05-04 LAB — RPR: RPR Ser Ql: NONREACTIVE

## 2010-05-04 LAB — CCBB MATERNAL DONOR DRAW

## 2010-05-22 ENCOUNTER — Encounter: Payer: Self-pay | Admitting: Family Medicine

## 2010-05-22 ENCOUNTER — Ambulatory Visit (INDEPENDENT_AMBULATORY_CARE_PROVIDER_SITE_OTHER): Payer: Self-pay | Admitting: Family Medicine

## 2010-05-22 VITALS — BP 111/72 | HR 66 | Temp 98.1°F | Ht 61.25 in | Wt 149.0 lb

## 2010-05-22 DIAGNOSIS — F341 Dysthymic disorder: Secondary | ICD-10-CM

## 2010-05-22 DIAGNOSIS — R6882 Decreased libido: Secondary | ICD-10-CM

## 2010-05-22 DIAGNOSIS — Z3009 Encounter for other general counseling and advice on contraception: Secondary | ICD-10-CM

## 2010-05-22 DIAGNOSIS — Z124 Encounter for screening for malignant neoplasm of cervix: Secondary | ICD-10-CM

## 2010-05-22 MED ORDER — BUPROPION HCL ER (SR) 150 MG PO TB12: 150.0000 mg | ORAL_TABLET | Freq: Two times a day (BID) | ORAL | Status: DC

## 2010-05-22 NOTE — Assessment & Plan Note (Signed)
Mirena IUD placed Feb 2011, strings visible today on exam.

## 2010-05-22 NOTE — Progress Notes (Signed)
  Subjective:    Patient ID: Beth Brown, female    DOB: [birth year], 28 y.o.   MRN: [id number]  HPI Visit conducted in Spanish.  Beth Brown is here for her PAP and to discuss her current medications.  She reports that she has had essentially no menses since placement of Mirena in February 2011 after delivery of youngest son.  Believes the Mirena may be associated with mild weight gain; unsure of her baseline pregravid weight before the past pregnancy.  Reviewed flow sheet with her from prior visits.    Regarding Bupropion, is taking one tablet once daily.  Says she does not feel depressed, but has had low libido and feels guilty for not reciprocating husband's attempts to make her happy.  She says that their relationship is solid and stable, but there is little time for them as a couple.  She does have family members who could help care for the kids while she and husband go out, but they do not do this now.   Patient does not smoke.  No vaginal discharge, no itch.  No dysuria.  No fevers or chills.    Review of Systems     Objective:   Physical Exam Well appearing, no apparent distress.  Positive affect.  Responds appropriately to questions.  Neck supple. ABD: Soft, nontender, nondistended.  Normal external genitalia.  Vaginal mucosa smooth with normal rugae, no lesions.  Cervix with visible IUD strings.  Transitional zone visible. No CMT on bimanual exam, no adnexal tenderness.        Assessment & Plan:

## 2010-05-22 NOTE — Assessment & Plan Note (Signed)
Decreased libido which may be related to dysthymia.  Plan increase in Wellbutrin to twice daily (as written), also the need of the couple to have time together to connect.

## 2010-05-22 NOTE — Assessment & Plan Note (Signed)
Only taking Wellbutrin one time daily.  To increase to twice daily.

## 2010-05-26 ENCOUNTER — Encounter: Payer: Self-pay | Admitting: Family Medicine

## 2010-06-19 NOTE — Op Note (Signed)
NAME:  Beth Brown, Beth Brown               ACCOUNT NO.:  [id number]   MEDICAL RECORD NO.:  [id number]          PATIENT TYPE:  AMB   LOCATION:  [facility]                          FACILITY:  [facility]   PHYSICIAN:  Christopher E. Ezzard Standing, M.D.DATE OF BIRTH:  27-Jan-1981   DATE OF PROCEDURE:  04/02/2005  DATE OF DISCHARGE:                                 OPERATIVE REPORT   PREOPERATIVE DIAGNOSIS:  Left lower lip mucocele.   POSTOPERATIVE DIAGNOSIS:  Left lower lip mucocele.   OPERATION:  Excision of left lower lip mucocele.   SURGEON:  Kristine Garbe. Ezzard Standing, M.D.   ANESTHESIA:  Local, 1% Xylocaine with 1:100,000 epinephrine.   COMPLICATIONS:  None.   BRIEF CLINICAL NOTE:  Beth Brown is a 30 year old female who has had a nodule of  the left lower lip for several months now on exam and is consistent with a  large mucocele. She is taken to the operating room at this time for excision  of a left lower lip mucocele. The lower lip was injected with Xylocaine with  epinephrine. The mucocele was then elliptically excised and sent to  pathology. The defect was closed with interrupted 5-0 chromic sutures. Beth Brown  tolerated this well. She was discharged home later this morning on Tylenol  p.r.n. pain and will follow up in my office in 1 week for recheck.           ______________________________  Kristine Garbe. Ezzard Standing, M.D.     CEN/MEDQ  D:  04/02/2005  T:  04/02/2005  Job:  [id number]   cc:   Kristine Garbe. Ezzard Standing, M.D.  Fax: [phone]

## 2010-10-26 ENCOUNTER — Ambulatory Visit: Payer: Self-pay | Admitting: Family Medicine

## 2010-10-27 LAB — COMPREHENSIVE METABOLIC PANEL
Alkaline Phosphatase: 192 — ABNORMAL HIGH
BUN: 5 — ABNORMAL LOW
CO2: 21
Chloride: 106
Creatinine, Ser: 0.43
GFR calc non Af Amer: >60
Glucose, Bld: 82
Potassium: 4
Total Bilirubin: 0.4

## 2010-10-27 LAB — CBC
HCT: 37.8
Hemoglobin: 13.3
MCHC: 35.3
MCV: 88.7
RBC: 4.26

## 2010-10-27 LAB — URIC ACID: Uric Acid, Serum: 5.5

## 2010-11-13 LAB — URINALYSIS, ROUTINE W REFLEX MICROSCOPIC
Bilirubin Urine: NEGATIVE
Nitrite: NEGATIVE
Specific Gravity, Urine: 1.015
Urobilinogen, UA: 0.2
pH: 5.5

## 2010-11-13 LAB — POCT PREGNANCY, URINE
Operator id: 27524
Preg Test, Ur: POSITIVE

## 2010-11-13 LAB — CBC
HCT: 34.6 — ABNORMAL LOW
Hemoglobin: 12.1
MCV: 87.5
Platelets: 227
RDW: 12.5
WBC: 10.6 — ABNORMAL HIGH

## 2010-11-13 LAB — GC/CHLAMYDIA PROBE AMP, GENITAL
Chlamydia, DNA Probe: NEGATIVE
GC Probe Amp, Genital: NEGATIVE

## 2010-11-13 LAB — WET PREP, GENITAL: Yeast Wet Prep HPF POC: NONE SEEN

## 2010-11-20 ENCOUNTER — Encounter: Payer: Self-pay | Admitting: Family Medicine

## 2010-11-20 ENCOUNTER — Ambulatory Visit (INDEPENDENT_AMBULATORY_CARE_PROVIDER_SITE_OTHER): Payer: Self-pay | Admitting: Family Medicine

## 2010-11-20 VITALS — BP 122/84 | HR 74 | Temp 98.0°F | Ht 61.25 in | Wt 143.0 lb

## 2010-11-20 DIAGNOSIS — Z3009 Encounter for other general counseling and advice on contraception: Secondary | ICD-10-CM

## 2010-11-20 DIAGNOSIS — B353 Tinea pedis: Secondary | ICD-10-CM | POA: Insufficient documentation

## 2010-11-20 DIAGNOSIS — R1013 Epigastric pain: Secondary | ICD-10-CM

## 2010-11-20 LAB — COMPREHENSIVE METABOLIC PANEL
ALT: 41 U/L — ABNORMAL HIGH (ref 0–35)
AST: 30 U/L (ref 0–37)
Albumin: 4.6 g/dL (ref 3.5–5.2)
Alkaline Phosphatase: 75 U/L (ref 39–117)
Calcium: 10.1 mg/dL (ref 8.4–10.5)
Chloride: 105 mEq/L (ref 96–112)
Potassium: 4.1 mEq/L (ref 3.5–5.3)
Sodium: 139 mEq/L (ref 135–145)
Total Protein: 7.6 g/dL (ref 6.0–8.3)

## 2010-11-20 LAB — CBC
HCT: 38.8 % (ref 36.0–46.0)
MCHC: 33.8 g/dL (ref 30.0–36.0)
Platelets: 252 10*3/uL (ref 150–400)
RDW: 13 % (ref 11.5–15.5)
WBC: 6.4 10*3/uL (ref 4.0–10.5)

## 2010-11-20 MED ORDER — OMEPRAZOLE 20 MG PO CPDR: 20.0000 mg | DELAYED_RELEASE_CAPSULE | Freq: Two times a day (BID) | ORAL | Status: DC

## 2010-11-20 MED ORDER — KETOCONAZOLE 2 % EX CREA: TOPICAL_CREAM | Freq: Every day | CUTANEOUS | Status: DC

## 2010-11-20 MED ORDER — METRONIDAZOLE 500 MG PO TABS: 500.0000 mg | ORAL_TABLET | Freq: Two times a day (BID) | ORAL | Status: DC

## 2010-11-20 MED ORDER — CLARITHROMYCIN 500 MG PO TABS: 500.0000 mg | ORAL_TABLET | Freq: Two times a day (BID) | ORAL | Status: DC

## 2010-11-20 NOTE — Patient Instructions (Signed)
Fue un Research officer, trade union.    1  Para el estomago, estoy mandando un ultrasonido del abdomen, tambien laboratorios para ver si se puede entender la causa del dolor.  Le hago contacto al [phone] [employer].  Trate de reducir la cantidad de grasa en la comida, incluyendo la comida frita, mantequillas/aceites, y bebidas con gas  2. Para el pie, mande' una crema ketoconazole 2% a la Corporate investment banker en [employer].  Apliquesela una vez por dia hasta que se le mejore.   3.  Como hablamos con respeto a la planificacion, creo que la T de cobre (sin hormonas) o las inyecciones de DepoProvera que le servian bien antes, serian buenos metodos para Production assistant, radio.  Converselo con su esposo y despues podemos hablar del tema el su proxima visita en 1 mes.  FOLLOW UP WITH DR Mauricio Po IN 4 TO 5 WEEKS.

## 2010-11-20 NOTE — Assessment & Plan Note (Signed)
Patient with epigastric and LUQ pain; has history of reflux symptoms, never before tested for H pylori.  Test done today, is positive.  After visit I called patient at her cell [phone] and reported results, instructions for triple therapy in PCN-allergic people.  Metronidazole, omeprazole and clarithromycin for 2 weeks.  Follow up in 4 weeks.
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
## 2010-11-20 NOTE — Assessment & Plan Note (Signed)
Dislikes acne side effect of Mirena, also ascribes her poor libido and pelvic discomfort to the Mirena.  Too forgetful for OCPs, although these sat well with her.  The Depo Provera worked well for her also.  Discussed paragard versus Explanon, which she is leery of because of the hormonal component.  Plans to discuss with her husband and bring topic to our next visit.

## 2010-11-23 ENCOUNTER — Encounter: Payer: Self-pay | Admitting: Family Medicine

## 2010-11-25 ENCOUNTER — Other Ambulatory Visit: Payer: Self-pay | Admitting: Family Medicine

## 2010-11-25 DIAGNOSIS — B353 Tinea pedis: Secondary | ICD-10-CM

## 2010-11-25 MED ORDER — OMEPRAZOLE 20 MG PO CPDR: 20.0000 mg | DELAYED_RELEASE_CAPSULE | Freq: Two times a day (BID) | ORAL | Status: DC

## 2010-11-25 MED ORDER — CLARITHROMYCIN 500 MG PO TABS: 500.0000 mg | ORAL_TABLET | Freq: Two times a day (BID) | ORAL | Status: AC

## 2010-11-25 MED ORDER — CLARITHROMYCIN 500 MG PO TABS: 500.0000 mg | ORAL_TABLET | Freq: Two times a day (BID) | ORAL | Status: DC

## 2010-11-25 MED ORDER — METRONIDAZOLE 500 MG PO TABS: 500.0000 mg | ORAL_TABLET | Freq: Two times a day (BID) | ORAL | Status: AC

## 2010-11-25 MED ORDER — KETOCONAZOLE 2 % EX CREA: TOPICAL_CREAM | Freq: Every day | CUTANEOUS | Status: DC

## 2010-11-25 MED ORDER — METRONIDAZOLE 500 MG PO TABS: 500.0000 mg | ORAL_TABLET | Freq: Two times a day (BID) | ORAL | Status: DC

## 2011-01-08 ENCOUNTER — Encounter: Payer: Self-pay | Admitting: Family Medicine

## 2011-01-08 ENCOUNTER — Ambulatory Visit (INDEPENDENT_AMBULATORY_CARE_PROVIDER_SITE_OTHER): Payer: Self-pay | Admitting: Family Medicine

## 2011-01-08 DIAGNOSIS — L708 Other acne: Secondary | ICD-10-CM

## 2011-01-08 DIAGNOSIS — R12 Heartburn: Secondary | ICD-10-CM

## 2011-01-08 DIAGNOSIS — L709 Acne, unspecified: Secondary | ICD-10-CM

## 2011-01-08 MED ORDER — CLINDAMYCIN PHOSPHATE 1 % EX GEL: Freq: Every day | CUTANEOUS | Status: AC

## 2011-01-08 NOTE — Progress Notes (Signed)
  Subjective:    Patient ID: Beth Brown, female    DOB: Jan 19, 1981, 30 y.o.   MRN: [id number]  HPI Visit conducted in Spanish.   Beth Brown comes in for follow up of 1) reflux symptoms; and 2) concerns about Mirena IUD and acne.   1) GERD symptoms: found to be positive for H pylori Ab on last visit, completed triple therapy for 2 weeks and tolerated well.  Notes marked reduction on frequency and severity of epigastric pain when she eats trigger foods such as spicy foods and greasy foods.  Not taking prilosec at all since treatment; previously had taken and noted decreased efficacy.  No nausea or vomiting, no changes in stool or bowel habits. Prior to the H pylori treatment, had bilateral pelvic crampy pain which has greatly subsided since treatment.   2) Has had Mirena IUD since soon after son born 2 yrs ago (Dec 2010).  Has had only very slight monthly spotting since then. Has had worsening facial acne in that time; previously had interval when she did not use hormonal birth control, noted improvement of acne.  Had used Mirena before that time and similarly had acne while Mirena in place. Aside from the acne, feels well with the Mirena (does not lament the loss of her menses).     Review of Systems See HPI     Objective:   Physical Exam Well appearing, no apparent distress HEENT Neck supple.  SKIN pustules measuring 2-82mm diameter across forehead, cheeks, neck ; upper chest and a few on upper back. Few hyperpigmented macules also noted across face.        Assessment & Plan:

## 2011-01-08 NOTE — Assessment & Plan Note (Signed)
Marked improvement since treatment for H pylori.  Will plan to treat for 6 more weeks with Prilosec 20mg  daily, diet modification.  No reason to retest at this time given improvement in clinical picture.

## 2011-01-08 NOTE — Patient Instructions (Signed)
Para el acne, quiero que use 2 medicinas topicas al mismo tiempo:  1. Benzoyl peroxide (sin receta, por [employer], [place]), que se aplica a la piel de la cara una vez por dia.   2. Clindamycin phosphate 1% gel, apliquesela una vez por dia, despues del benzoyl peroxide.  Las dos pueden resecarle la piel.  Debe usar un bloqueador de sol en la cada.   Jabon de neutrogena, para lavarse la cara antes de aplicarse las cremas/gel.   Para el estomago, quiero que vuelva a usar la Prilosec 20mg  una vez por dia, por las proximas 6 semanas. Vigilar la alimentacion (grasas, picantes).

## 2012-08-01 ENCOUNTER — Encounter: Payer: Self-pay | Admitting: Family Medicine

## 2012-08-01 ENCOUNTER — Ambulatory Visit (INDEPENDENT_AMBULATORY_CARE_PROVIDER_SITE_OTHER): Payer: Self-pay | Admitting: Family Medicine

## 2012-08-01 VITALS — BP 119/55 | HR 67 | Temp 98.7°F | Ht 61.25 in | Wt 150.9 lb

## 2012-08-01 DIAGNOSIS — H169 Unspecified keratitis: Secondary | ICD-10-CM | POA: Insufficient documentation

## 2012-08-01 DIAGNOSIS — H109 Unspecified conjunctivitis: Secondary | ICD-10-CM

## 2012-08-01 MED ORDER — ERYTHROMYCIN 5 MG/GM OP OINT: TOPICAL_OINTMENT | Freq: Four times a day (QID) | OPHTHALMIC | Status: DC

## 2012-08-01 NOTE — Progress Notes (Signed)
Family Medicine Office Visit Note   Subjective:   Patient ID: Beth Brown, female  DOB: May 15, 1980, 32 y.o.. MRN: [id number]   Visit Conducted in Spanish. Pt comes today for same day appointment complaining of red eye since last Thursday. Symptoms started on Wednesday night with mild sore throat and itchy eyes after working picking up a pile of old dry leaves in her backyard. The next day she reports it was worse and redness was more evident on her left eye. This progressed to increased erythema, eye drainage and ocular discomfort.. Reports pain when blinking, but denies pain with eye movements. No palpebral or surrounded tissue involvement. She describes ocular discomfort whit intense light but tolerates room illumination without difficulty. Denies vision changes, headaches, nausea, vomiting, fever, chills or other systemic symptoms.  No hx of sick contact. No other people affected on her household or school.  Review of Systems:  Per HPI  Objective:   Physical Exam: Gen:  NAD HEENT: Moist mucous membranes. EYES: Vision 20/20 in both eyes without correction. Marked erythema on conjunctiva of Left eye. Mild purulent discharge more evident in eyelashes. Normal pupil size, shape and pupillary reflex to light and accomodation. Fluorescein examination negative for corneal involvement.  EOMI. NECK supple, no adenopathies. CV: Regular rate and rhythm, no murmurs rubs or gallop PULM: Clear to auscultation bilaterally. No wheezes/rales/rhonch Neuro: Alert and oriented x3. No focalization.  Assessment & Plan:

## 2012-08-01 NOTE — Assessment & Plan Note (Signed)
From Hx and exam most likely bacterial conjunctivitis. No visual changes or pain with ocular movements.  P/ Ophthalmic antibiotic and close follow up. Case precepted with attending Dr. Jennette Kettle who agrees with our plan.  Discussed with pt signs of worsening condition that should prompt emergent re-evaluation. F/u tomorrow.

## 2012-08-01 NOTE — Patient Instructions (Addendum)
Conjuntivitis bacteriana  (Bacterial Conjunctivitis)  La conjuntivitis bacteriana, comnmente llamada ojo rosado, es una inflamacin de la membrana transparente que cubre la parte blanca del ojo (conjuntiva). La inflamacin tambin puede ocurrir en la parte interna de los prpados. Los vasos sanguneos de la conjuntiva se inflaman haciendo que el ojo se ponga de color rojo o rosado. La conjuntivitis bacteriana puede propagarse fcilmente de un ojo a otro y de Ardelia Mems persona a otra (es contagiosa).  CAUSAS  La causa de la conjuntivitis bacteriana es una bacteria. La bacteria puede provenir de la propia piel, del tracto respiratorio superior o de otra persona que padece conjuntivitis bacteriana.  SNTOMAS  La parte del ojo o la zona interna del prpado que normalmente son blancas se ven de color rosado o rojo. El ojo rosado se asocia a Psychologist, prison and probation services, lagrimeo y sensibilidad a Naval architect. La conjuntivitis bacteriana se asocia a una secrecin espesa y amarillenta de los ojos. La secrecin puede convertirse en una costra en el prpado durante la noche, lo que hace que los prpados se peguen. Si tiene secrecin, tambin puede tener visin borrosa en el ojo afectado.  DIAGNSTICO  El diagnstico de conjuntivitis bacteriana lo realiza el mdico con un examen de la vista y por los sntomas que usted refiere. Su mdico observar si hay cambios en los tejidos de la superficie de los ojos, los que pueden indicar el tipo especfico de conjuntivitis. Tomar una muestra de la secrecin con un hisopo de algodn si la conjuntivitis es grave, si la crnea se ve afectada, o si sufre repetidas infecciones que no responden al tratamiento. Luego enva la muestra a un laboratorio para diagnosticar si la causa de la inflamacin es una infeccin bacteriana y para comprobar si responder a los antibiticos.  TRATAMIENTO   La conjuntivitis bacteriana se trata con antibiticos. Generalmente se recetan gotas oftlmicas con antibitico. Tambin  hay ungentos con antibiticos disponibles. En algunos casos se recetan antibiticos por va oral. Las lgrimas artificiales o el lavado del ojo pueden aliviar las [place]. INSTRUCCIONES PARA EL CUIDADO EN EL HOGAR   Para aliviar el malestar, aplique un pao hmedo, limpio y fro en el ojo durante 10 a 20 minutos, 3 a 4 veces por da.  Limpie suavemente las secreciones del ojo con un pao tibio y hmedo o una bolita de algodn.  Lave sus manos frecuentemente con agua y [place]. Use toallas de papel para secarse las manos.  No comparta toallones ni toallas de mano. As podr diseminarse la infeccin.  Cambie o lave la funda de la [employer]  No use maquillaje en los ojos hasta que la infeccin haya desaparecido.  No maneje maquinaria ni conduzca vehculos si su visin es borrosa.  Deje de usar los entes de [place]. Consulte con su mdico si debe esterilizar o reemplazar sus lentes de contacto antes de usarlos de nuevo. Esto depende del tipo de lentes de contacto que use.  Al aplicarse el medicamento en el ojo infectado, no toque el borde del prpado con el frasco de gotas para los ojos o el tubo de [place]. SOLICITE ATENCIN MDICA DE INMEDIATO SI:   La infeccin no mejora dentro de los 3 das despus de iniciar el tratamiento.  Tuvo una secrecin amarillenta en el ojo y vuelve a Arts administrator.  Aumenta el dolor en el ojo.  El enrojecimiento se extiende.  La visin se vuelve borrosa.  Tiene fiebre o sntomas persistentes durante ms de 2  3 das.  Jaclynn Guarneri y  los sntomas empeoran repentinamente.  Siente dolor, enrojecimiento o Licensed conveyancer. ASEGRESE DE QUE:   Comprende estas instrucciones.  Controlar su enfermedad.  Solicitar ayuda de inmediato si no mejora o si empeora. Document Released: 10/28/2004 Document Revised: 10/13/2011 [facility] Patient Information 2014 [place], [place].  Haga una cita de seguimiento para manana 2 de Julio/2014

## 2012-08-02 ENCOUNTER — Ambulatory Visit (INDEPENDENT_AMBULATORY_CARE_PROVIDER_SITE_OTHER): Payer: Self-pay | Admitting: Family Medicine

## 2012-08-02 ENCOUNTER — Encounter: Payer: Self-pay | Admitting: Family Medicine

## 2012-08-02 VITALS — BP 109/64 | HR 64 | Temp 98.4°F | Ht 61.25 in | Wt 149.0 lb

## 2012-08-02 DIAGNOSIS — H169 Unspecified keratitis: Secondary | ICD-10-CM

## 2012-08-02 NOTE — Assessment & Plan Note (Addendum)
New vision changes and physical exam positive for corneal opacity.  P/ Referral to Ophthalmologist.  We discussed with pt, she reports has no insurance and also can not pay upfront for visit. Appointment was coordinated and given for Friday 4th at 1100am.  Eye patched today. Instructed to continue with erythromycin and patch should be changed every time med is apply. F/u with [place] an needed.

## 2012-08-02 NOTE — Patient Instructions (Signed)
Su cornea (parte transparente mas anterior de ojo) esta afectada. necesita verse con el oftalmologo. Se le ha coordinado una cita para el viernes por favor no la pierda. Continue poniendose la pomada y tape el ojo cada vez. Marque una cita con nosotros si lo necesitase.  Recuerde que si tuviese dolor, mas supuracion o dificultad progresiva de la vista necesita ir a Audiological scientist.

## 2012-08-02 NOTE — Progress Notes (Signed)
Family Medicine Office Visit Note   Subjective:   Patient ID: Beth Brown, female  DOB: 1980/06/26, 32 y.o.. MRN: [id number]   Pt that comes today for f/u bacterial conjunctivitis. She reports since yesterday redness has improved but has noticed blurred vision on left eye. This change in vision is reported to be more evident on her left upper temporal quadrant. Her eye discharge has also improved, but now she reports more discomfort with light and accomodation. Continues to deny pain with extraocular movements.   Pt denies headache, nausea, vomiting or systemic symptoms.  Review of Systems:  Per HPI.  Objective:   Physical Exam: Gen: NAD  HEENT: Moist mucous membranes.  EYES: Vision 20/20 on Right eye  and  20/40 on the left eye without correction. Marked erythema on conjunctiva of Left eye. Mild purulent discharge more evident in eyelashes. Normal pupil size, shape and pupillary reflex to light and accomodation. Fluorescein examination with corneal involvement: Oblique opacity from 2 to 7 on diagonal fashion seen today.  Assessment & Plan:

## 2012-08-03 ENCOUNTER — Other Ambulatory Visit: Payer: Self-pay | Admitting: Family Medicine

## 2012-08-03 DIAGNOSIS — H169 Unspecified keratitis: Secondary | ICD-10-CM

## 2012-08-03 NOTE — Addendum Note (Signed)
Addended by: Damita Lack on: 08/03/2012 02:23 PM   Modules accepted: Orders

## 2012-11-28 ENCOUNTER — Ambulatory Visit: Payer: Self-pay

## 2012-12-05 ENCOUNTER — Emergency Department (HOSPITAL_COMMUNITY)
Admission: EM | Admit: 2012-12-05 | Discharge: 2012-12-06 | Disposition: A | Discharge status: Home / Self Care | Payer: Self-pay | Attending: Emergency Medicine | Admitting: Emergency Medicine

## 2012-12-05 ENCOUNTER — Encounter (HOSPITAL_COMMUNITY): Payer: Self-pay | Admitting: Emergency Medicine

## 2012-12-05 DIAGNOSIS — Z880 Allergy status to penicillin: Secondary | ICD-10-CM | POA: Insufficient documentation

## 2012-12-05 DIAGNOSIS — Z3202 Encounter for pregnancy test, result negative: Secondary | ICD-10-CM | POA: Insufficient documentation

## 2012-12-05 DIAGNOSIS — N390 Urinary tract infection, site not specified: Secondary | ICD-10-CM | POA: Insufficient documentation

## 2012-12-05 LAB — URINALYSIS, ROUTINE W REFLEX MICROSCOPIC
Bilirubin Urine: NEGATIVE
Glucose, UA: NEGATIVE mg/dL
Ketones, ur: NEGATIVE mg/dL
pH: 7 (ref 5.0–8.0)

## 2012-12-05 LAB — URINE MICROSCOPIC-ADD ON

## 2012-12-05 NOTE — ED Notes (Signed)
Patient with urinary pain and retention.  Patient states she does have some blood when she urinates.  Patient looks uncomfortable at this time.

## 2012-12-05 NOTE — ED Provider Notes (Signed)
CSN: [id number]     Arrival date & time 12/05/12  2201 History   First MD Initiated Contact with Patient 12/05/12 2351     Chief Complaint  Patient presents with  . Urinary Retention   (Consider location/radiation/quality/duration/timing/severity/associated sxs/prior Treatment) HPI Patient presents with 2 days of urinary frequency, dysuria, gross hematuria and hesitancy. She denies any flank pain, fever, chills, vomiting or vaginal complaints. She denies any abdominal pain. History reviewed. No pertinent past medical history. History reviewed. No pertinent past surgical history. History reviewed. No pertinent family history. History  Substance Use Topics  . Smoking status: Never Smoker   . Smokeless tobacco: Not on file  . Alcohol Use: No   OB History   Grav Para Term Preterm Abortions TAB SAB Ect Mult Living                 Review of Systems  Constitutional: Negative for fever and chills.  Respiratory: Negative for shortness of breath.   Cardiovascular: Negative for chest pain.  Gastrointestinal: Negative for nausea, vomiting, abdominal pain and diarrhea.  Genitourinary: Positive for dysuria, frequency, hematuria and difficulty urinating. Negative for flank pain, vaginal bleeding, vaginal discharge and pelvic pain.  Musculoskeletal: Negative for back pain, neck pain and neck stiffness.  Skin: Negative for rash and wound.  All other systems reviewed and are negative.    Allergies  Penicillins  Home Medications   Current Outpatient Rx  Name  Route  Sig  Dispense  Refill  . ibuprofen (ADVIL,MOTRIN) 200 MG tablet   Oral   Take 200 mg by mouth every 6 (six) hours as needed for mild pain.          BP 131/73  Pulse 73  Temp(Src) 98.3 F (36.8 C) (Oral)  Resp 16  Ht 5\' 2"  (1.575 m)  Wt 158 lb 8 oz (71.895 kg)  BMI 28.98 kg/m2  SpO2 99% Physical Exam  Nursing note and vitals reviewed. Constitutional: She is oriented to person, place, and time. She appears  well-developed and well-nourished. No distress.  Patient is very well-appearing in no acute distress  HENT:  Head: Normocephalic and atraumatic.  Mouth/Throat: Oropharynx is clear and moist.  Eyes: EOM are normal. Pupils are equal, round, and reactive to light.  Neck: Normal range of motion. Neck supple.  Cardiovascular: Normal rate and regular rhythm.   Pulmonary/Chest: Effort normal and breath sounds normal. No respiratory distress. She has no wheezes. She has no rales.  Abdominal: Soft. Bowel sounds are normal. She exhibits no distension and no mass. There is tenderness (mild suprapubic tenderness to palpation.). There is no rebound and no guarding.  Musculoskeletal: Normal range of motion. She exhibits no edema and no tenderness.  No CVA tenderness bilaterally.  Neurological: She is alert and oriented to person, place, and time.  Skin: Skin is warm and dry. No rash noted. No erythema.  Psychiatric: She has a normal mood and affect. Her behavior is normal.    ED Course  Procedures (including critical care time) Labs Review Labs Reviewed  URINALYSIS, ROUTINE W REFLEX MICROSCOPIC - Abnormal; Notable for the following:    Color, Urine AMBER (*)    APPearance TURBID (*)    Hgb urine dipstick LARGE (*)    Protein, ur >300 (*)    Leukocytes, UA LARGE (*)    All other components within normal limits  URINE MICROSCOPIC-ADD ON - Abnormal; Notable for the following:    Squamous Epithelial / LPF FEW (*)    Bacteria,  UA MANY (*)    All other components within normal limits  URINE CULTURE  POCT PREGNANCY, URINE   Imaging Review No results found.  EKG Interpretation   None       MDM   1. UTI (urinary tract infection)    Patient must return for worsening symptoms, flank pain, fever, vomiting or any concerns.   Loren Racer, MD 12/06/12 0000

## 2012-12-06 MED ORDER — NITROFURANTOIN MONOHYD MACRO 100 MG PO CAPS: 100.0000 mg | ORAL_CAPSULE | Freq: Two times a day (BID) | ORAL | Status: DC

## 2012-12-06 MED ORDER — PHENAZOPYRIDINE HCL 200 MG PO TABS: 200.0000 mg | ORAL_TABLET | Freq: Three times a day (TID) | ORAL | Status: DC | PRN

## 2012-12-08 LAB — URINE CULTURE

## 2013-01-09 ENCOUNTER — Ambulatory Visit (INDEPENDENT_AMBULATORY_CARE_PROVIDER_SITE_OTHER): Payer: No Typology Code available for payment source | Admitting: Family Medicine

## 2013-01-09 ENCOUNTER — Encounter: Payer: Self-pay | Admitting: Family Medicine

## 2013-01-09 VITALS — BP 115/71 | HR 60 | Ht 61.25 in | Wt 158.0 lb

## 2013-01-09 DIAGNOSIS — R635 Abnormal weight gain: Secondary | ICD-10-CM

## 2013-01-09 DIAGNOSIS — B353 Tinea pedis: Secondary | ICD-10-CM

## 2013-01-09 DIAGNOSIS — R12 Heartburn: Secondary | ICD-10-CM

## 2013-01-09 DIAGNOSIS — Z3009 Encounter for other general counseling and advice on contraception: Secondary | ICD-10-CM

## 2013-01-09 LAB — LIPID PANEL
HDL: 46 mg/dL (ref >39)
LDL Cholesterol: 72 mg/dL (ref 0–99)
Triglycerides: 101 mg/dL (ref <150)
VLDL: 20 mg/dL (ref 0–40)

## 2013-01-09 LAB — CBC
HCT: 37.9 % (ref 36.0–46.0)
MCH: 29.1 pg (ref 26.0–34.0)
Platelets: 223 10*3/uL (ref 150–400)
RBC: 4.47 MIL/uL (ref 3.87–5.11)
RDW: 12.7 % (ref 11.5–15.5)
WBC: 7.2 10*3/uL (ref 4.0–10.5)

## 2013-01-09 LAB — BASIC METABOLIC PANEL
BUN: 15 mg/dL (ref 6–23)
Calcium: 9.6 mg/dL (ref 8.4–10.5)
Creat: 0.48 mg/dL — ABNORMAL LOW (ref 0.50–1.10)
Potassium: 3.7 mEq/L (ref 3.5–5.3)

## 2013-01-09 LAB — POCT GLYCOSYLATED HEMOGLOBIN (HGB A1C): Hemoglobin A1C: 5.5

## 2013-01-09 MED ORDER — KETOCONAZOLE 2 % EX CREA: TOPICAL_CREAM | Freq: Every day | CUTANEOUS | Status: AC

## 2013-01-09 NOTE — Assessment & Plan Note (Signed)
Patient requests Mirena removal at subsequent visit.  Plans to use condoms.  Is not inclined to use other progesterone-based forms of birth control.  Is not interested in Paragard.  Plan for condom use and timing of intercourse to avoid fertile periods.

## 2013-01-09 NOTE — Progress Notes (Signed)
   Subjective:    Patient ID: Beth Brown, female    DOB: January 23, 1981, 32 y.o.   MRN: [id number]  HPI Visit conducted in Spanish.   Beth Brown comes in today for a couple of complaints.  She has had a difficult time controlling acne since she had Mirena IUD placed in Dec 2010.  Has noticed emotional lability, emotional eating, and weight gain since that time.  Had previously had Mirena and had some of the same symptoms. Is prone to emotional irritability but stops short of saying it's depression.  States that her marriage is in a good place,but she does become annoyed easily with her husband.  Wishes to have IUD removed and resume having menses.  She is amenorrheic but does have monthly severe cramps, as though she were having her menses.   She and husband plan on condom use for prevention of pregnancy, coupled with rhythm method.   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  Will complete her coursework in 4 months.   Family Hx; No family hx diabetes.     Review of Systems Weight gain; sleepy during the day.  Anxious.  Denies anhedonia, but does suffer from loss of libido.     Objective:   Physical Exam Well appearing, bright affect, no apparent distress. Well groomed.  HEENT neck supple. Mild facial acne noted, not nummular or scarring.        Assessment & Plan:

## 2013-01-09 NOTE — Assessment & Plan Note (Signed)
Controlled with prilosec

## 2013-01-09 NOTE — Patient Instructions (Signed)
Fue un Research officer, trade union.  Estamos chequeando algunos laboratorios para Neurosurgeon, la tiroide, y el conteo de [place].  Le contacto con los rsultados.  Mande' otra receta para el pie de atleta.   Marcar cita para quitarse el dispositivo intrauterino.  APPOINTMENT FOR IUD REMOVAL-ONLY WITH DR Mauricio Po OR NEXT AVAILABLE PHYSICIAN.

## 2013-01-09 NOTE — Assessment & Plan Note (Signed)
Suspect this is due to anxiety and subsequent weight gain.  To check TSH and CMet, CBC.  She is certain there is a component of hormonal influence from the Mirena as well.  Plan for Mirena removal at subsequent visit.

## 2013-01-12 ENCOUNTER — Ambulatory Visit: Payer: No Typology Code available for payment source | Admitting: Family Medicine

## 2013-01-15 ENCOUNTER — Encounter: Payer: Self-pay | Admitting: Family Medicine

## 2013-02-27 ENCOUNTER — Ambulatory Visit (INDEPENDENT_AMBULATORY_CARE_PROVIDER_SITE_OTHER): Payer: No Typology Code available for payment source | Admitting: Family Medicine

## 2013-02-27 ENCOUNTER — Encounter: Payer: Self-pay | Admitting: Family Medicine

## 2013-02-27 VITALS — BP 128/77 | HR 69 | Temp 98.5°F | Ht 61.25 in | Wt 162.0 lb

## 2013-02-27 DIAGNOSIS — R300 Dysuria: Secondary | ICD-10-CM | POA: Insufficient documentation

## 2013-02-27 LAB — POCT URINALYSIS DIPSTICK
Bilirubin, UA: NEGATIVE
Glucose, UA: NEGATIVE
Ketones, UA: NEGATIVE
Nitrite, UA: POSITIVE
PROTEIN UA: 100
UROBILINOGEN UA: 0.2
pH, UA: 6

## 2013-02-27 LAB — POCT UA - MICROSCOPIC ONLY

## 2013-02-27 MED ORDER — CIPROFLOXACIN HCL 500 MG PO TABS: 500.0000 mg | ORAL_TABLET | Freq: Two times a day (BID) | ORAL | Status: DC

## 2013-02-27 NOTE — Patient Instructions (Signed)
Beth Brown , Gracias por venir a la Programmer, applicationsclnica hoy . Fue bueno conocerte!  Hoy hemos discutido su dolor al ConocoPhillipsorinar . 1. Su anlisis de [place] muestra signos de una infeccin del tracto urinario, esto es lo ms probable es que la causa de su dolor y Retail buyersangre en su orina . 2. Vamos a tratar esto con un antibitico ( ciprofloxacino ) , he enviado la receta a la farmacia de [facility] . 3. Adicionalmente se han cultivado su orina , y confirmar que la bacteria es sensible a este antibitico . Biochemist, clinicalodemos llamar a usted y Education officer, environmentalrealizar un cambio en su tratamiento si es necesario. De lo contrario,  Empezamos una nueva medicina hoy en da para tratar su infeccin del tracto urinario . Ciprofloxacina 500 mg , por favor, tomar 1 comprimido 2 veces al da durante 3 das ( un total de 6 pastillas prescritas ) como discutimos . Evite el consumo de [place] / o cualquiera de los productos lcteos durante 4 horas despus de Financial risk analysttomar este medicamento.  Algunos nmeros importantes de la visita de hoy : Resultados - sugerente anlisis de orina de la infeccin urinaria  Por favor, programar una cita de seguimiento con el Dr. Mauricio PoBreen si sus sntomas no mejoran a finales de esta semana.  Si tiene [place] otra pregunta o inquietud, no dude en llamar a la clnica en contacto conmigo. Tambin puede programar una cita antes si es necesario .  Sin embargo , si sus Sport and exercise psychologistsntomas significativamente peor , por favor acuda a la sala de emergencia para buscar atencin mdica inmediata .  -------------------  Beth Brown, Thank you for coming in to clinic today. It was good to meet you!  Today we discussed your Pain with urination. 1. Your urinalysis shows signs of a urinary tract infection, this is most likely the cause of your pain and blood in your urine. 2. We will treat this with an antibiotic (Ciprofloxacin), [facility]. 3. Additionally we have cultured your urine, and will  confirm that the bacteria is sensitive to this antibiotic. We may call you and make a change to your treatment if needed. Otherwise,   We started a new medication today to treat your Urinary Tract Infection. Ciprofloxacin 500 mg, please take 1 tablet 2 times a day for 3 days (total 6 pills prescribed) as we discussed. Avoid drinking milk / or any dairy products for 4 hours after taking this medicine.  Some important numbers from today's visit: Results - Urinalysis suggestive of urinary infection  Please schedule a follow-up appointment with Dr. Mauricio PoBreen if your symptoms do not improve by the end of this week.   If you have any other questions or concerns, please feel free to call the clinic to contact me. You may also schedule an earlier appointment if necessary.  However, if your symptoms get significantly worse, please go to the Emergency Department to seek immediate medical attention.  Saralyn PilarAlexander Rolly Magri, DO [facility]

## 2013-02-27 NOTE — Assessment & Plan Note (Signed)
Suspect uncomplicated UTI with positive UA (+nitrite, +2 LE, TNTC WBC, large RBC). Afebrile, no flank pain, no nausea / vomiting, tolerating PO. No concerns for pyelo or upper UTI - Last UTI 12/2012 treated with Macrobid, pan-sensitive E.Coli, no significant h/o frequent, UTI < 1 per yr  Plan: 1. Cipro 500mg  BID x 3 days (#6 pills), avoid PCN and cephalosporins given h/o PCN allergy with hives / difficulty breathing 2. Ordered Urine Culture - pending results, will adjust antibiotic if necessary 3. RTC 1 week if not improved, worsening

## 2013-02-27 NOTE — Progress Notes (Signed)
Subjective:     Patient ID: Beth Brown, female   DOB: [birth year], 33 y.o.   MRN: [id number]  Patient presents for a same day appointment. Interview conducted with assistance of Spanish Interpreter.  HPI  DYSURIA: Symptoms started about 1 week ago with dysuria, inc urinary frequency (6-8x daily), suprapubic pain, and noted 2 days ago with some "blood" in her urine, noted that her urine is yellow but has small darker spots "clots" in her urine, no gross blood. - similar symptoms last happened November 2014 (although currently significantly less pain) - LMP: no regular periods / bleeding, has Mirena IUD in place  PMH: Last UTI 12/2012 (treated in ED with macrobid), otherwise prior UTI > 4 yrs ago  I have reviewed and updated the following as appropriate: allergies and current medications  Social Hx: Never smoker  Review of Systems  See above HPI, additionally: Denies fever/chills, CP, back / flank pain, radiation of pain, nausea / vomiting, diarrhea, gross hematuria, fatigue, weakness.      Objective:   Physical Exam  BP 128/77  Pulse 69  Temp(Src) 98.5 F (36.9 C) (Oral)  Ht 5' 1.25" (1.556 m)  Wt 162 lb (73.483 kg)  BMI 30.35 kg/m2  Gen - pleasant, well-appearing, NAD Heart - RRR, no murmurs heard Lungs - CTAB, no wheezing, crackles, or rhonchi. Normal work of breathing. Abd - soft, +mild suprapubic tenderness otherwise no generalized/focal abdominal tenderness, no masses, +active BS MSK - no CVAT Ext - non-tender, no edema, peripheral pulses intact +2 b/l Skin - warm, dry, no rashes Neuro - awake, alert, oriented     Assessment:     See specific A&P problem list for details.      Plan:     See specific A&P problem list for details.

## 2013-03-01 LAB — URINE CULTURE: Colony Count: >=100000

## 2013-03-12 ENCOUNTER — Ambulatory Visit: Payer: Self-pay

## 2013-09-25 ENCOUNTER — Encounter: Payer: Self-pay | Admitting: Family Medicine

## 2014-07-18 ENCOUNTER — Ambulatory Visit: Payer: Self-pay

## 2015-07-01 ENCOUNTER — Ambulatory Visit: Payer: Self-pay

## 2015-10-21 ENCOUNTER — Other Ambulatory Visit (INDEPENDENT_AMBULATORY_CARE_PROVIDER_SITE_OTHER): Payer: Self-pay

## 2015-10-21 DIAGNOSIS — Z3481 Encounter for supervision of other normal pregnancy, first trimester: Secondary | ICD-10-CM

## 2015-10-21 LAB — POCT URINALYSIS DIPSTICK
BILIRUBIN UA: NEGATIVE
Glucose, UA: NEGATIVE
Ketones, UA: NEGATIVE
Leukocytes, UA: NEGATIVE
Nitrite, UA: NEGATIVE
PH UA: 7.5
Spec Grav, UA: 1.02
UROBILINOGEN UA: 0.2

## 2015-10-21 LAB — POCT UA - MICROSCOPIC ONLY

## 2015-10-22 LAB — OBSTETRIC PANEL
ANTIBODY SCREEN: NEGATIVE
BASOS PCT: 0 %
Basophils Absolute: 0 cells/uL (ref 0–200)
EOS PCT: 2 %
Eosinophils Absolute: 166 cells/uL (ref 15–500)
HEMATOCRIT: 37.5 % (ref 35.0–45.0)
HEMOGLOBIN: 12.7 g/dL (ref 11.7–15.5)
Hepatitis B Surface Ag: NEGATIVE
LYMPHS PCT: 28 %
Lymphs Abs: 2324 cells/uL (ref 850–3900)
MCH: 29.2 pg (ref 27.0–33.0)
MCHC: 33.9 g/dL (ref 32.0–36.0)
MCV: 86.2 fL (ref 80.0–100.0)
MPV: 10.9 fL (ref 7.5–12.5)
Monocytes Absolute: 332 cells/uL (ref 200–950)
Monocytes Relative: 4 %
NEUTROS ABS: 5478 {cells}/uL (ref 1500–7800)
Neutrophils Relative %: 66 %
Platelets: 239 10*3/uL (ref 140–400)
RBC: 4.35 MIL/uL (ref 3.80–5.10)
RDW: 13.5 % (ref 11.0–15.0)
Rh Type: POSITIVE
Rubella: 4.22 Index — ABNORMAL HIGH (ref <0.90)
WBC: 8.3 10*3/uL (ref 3.8–10.8)

## 2015-10-22 LAB — HIV ANTIBODY (ROUTINE TESTING W REFLEX): HIV: NONREACTIVE

## 2015-10-22 LAB — CULTURE, OB URINE: Organism ID, Bacteria: NO GROWTH

## 2015-10-28 ENCOUNTER — Encounter: Payer: Self-pay | Admitting: Obstetrics and Gynecology

## 2015-10-28 ENCOUNTER — Ambulatory Visit (INDEPENDENT_AMBULATORY_CARE_PROVIDER_SITE_OTHER): Payer: Self-pay | Admitting: Obstetrics and Gynecology

## 2015-10-28 VITALS — BP 122/66 | HR 98 | Temp 99.5°F | Wt 157.0 lb

## 2015-10-28 DIAGNOSIS — Z3490 Encounter for supervision of normal pregnancy, unspecified, unspecified trimester: Secondary | ICD-10-CM | POA: Insufficient documentation

## 2015-10-28 DIAGNOSIS — Z23 Encounter for immunization: Secondary | ICD-10-CM

## 2015-10-28 DIAGNOSIS — Z3481 Encounter for supervision of other normal pregnancy, first trimester: Secondary | ICD-10-CM

## 2015-10-28 MED ORDER — PRENATAL VITAMIN 27-0.8 MG PO TABS: 1.0000 | ORAL_TABLET | Freq: Every day | ORAL | 12 refills | Status: DC

## 2015-10-28 NOTE — Patient Instructions (Addendum)
Return in 4 weeks to see me, Dr. Doroteo Glassman Prenatal vitamins sent to pahrmacy  Primer trimestre de embarazo (First Trimester of Pregnancy) El primer trimestre de Psychiatrist se extiende desde la semana1 hasta el final de la semana12 (mes1 al mes3). Una semana despus de que un espermatozoide fecunda un vulo, este se implantar en la pared uterina. Este embrin comenzar a Camera operator convertirse en un beb. Sus genes y los de su pareja forman el beb. Los genes del varn determinan si ser un nio o una nia. Entre la semana6 y Lane, se forman los ojos y [place], y los latidos del corazn pueden verse en la ecografa. Al final de las 12semanas, todos los rganos del beb estn formados.  Ahora que est embarazada, querr hacer todo lo que est a su alcance para tener un beb sano. Dos de las cosas ms importantes son Winferd Humphrey buena atencin prenatal y seguir las indicaciones del mdico. La atencin prenatal incluye toda la asistencia mdica que usted recibe antes del nacimiento del beb. Esta ayudar a prevenir, Engineer, manufacturing y tratar cualquier problema durante el embarazo y [place]. CAMBIOS EN EL ORGANISMO Su organismo atraviesa por muchos cambios durante el [place], y estos varan de Neomia Dear mujer a Educational psychologist.   Al principio, puede aumentar o bajar algunos kilos.  Puede tener Programme researcher, broadcasting/film/video (nuseas) y vomitar. Si no puede controlar los vmitos, llame al mdico.  Puede cansarse con facilidad.  Es posible que tenga dolores de cabeza que pueden aliviarse con los medicamentos que el mdico le permita tomar.  Puede orinar con mayor frecuencia. El dolor al orinar puede significar que usted tiene una infeccin de la vejiga.  Debido al Vanetta Mulders, puede tener acidez estomacal.  Puede estar estreida, ya que ciertas hormonas enlentecen los movimientos de los msculos que [employer] desechos a travs de los intestinos.  Pueden aparecer hemorroides o abultarse e hincharse las venas (venas  varicosas).  Las ConAgra Foods pueden empezar a Government social research officer y Emergency planning/management officer. Los pezones pueden sobresalir ms, y el tejido que los rodea (areola) tornarse ms oscuro.  Las Veterinary surgeon y estar sensibles al cepillado y al hilo dental.  Pueden aparecer zonas oscuras o manchas (cloasma, mscara del Psychiatrist) en el rostro que probablemente se atenuarn despus del nacimiento del beb.  Los perodos menstruales se interrumpirn.  Tal vez no tenga apetito.  Puede sentir un fuerte deseo de consumir ciertos alimentos.  Puede tener cambios a Theatre manager a da, por ejemplo, por momentos puede estar emocionada por el Psychiatrist y por otros preocuparse porque algo pueda salir mal con el embarazo o el beb.  Tendr sueos ms vvidos y extraos.  Tal vez haya cambios en el cabello que pueden incluir su engrosamiento, crecimiento rpido y cambios en la textura. A algunas mujeres tambin se les cae el cabello durante o despus del [place], o tienen el cabello seco o fino. Lo ms probable es que el cabello se le normalice despus del nacimiento del beb. QU DEBE ESPERAR EN LAS CONSULTAS PRENATALES Durante una visita prenatal de rutina:  La pesarn para asegurarse de que usted y el beb estn creciendo normalmente.  Le controlarn la presin arterial.  Le medirn el abdomen para controlar el desarrollo del beb.  Se escucharn los latidos cardacos a partir de la semana10 o la12 de embarazo, aproximadamente.  Se analizarn los resultados de los estudios solicitados en visitas anteriores. El mdico puede preguntarle:  Cmo se siente.  Si siente los movimientos del beb.  Si ha tenido sntomas anormales, como prdida de lquido, La Coma Heightssangrado, dolores de cabeza intensos o clicos abdominales.  Si est consumiendo algn producto que contenga tabaco, como cigarrillos, tabaco de Theatre managermascar y Administrator, Civil Servicecigarrillos electrnicos.  Si tiene Colgate-Palmolivealguna pregunta. Otros estudios que pueden realizarse durante el  primer trimestre incluyen lo siguiente:  Anlisis de sangre para determinar el tipo de sangre y Engineer, manufacturingdetectar la presencia de infecciones previas. Adems, se los usar para controlar si los niveles de hierro son bajos (anemia) y Chief Strategy Officerdeterminar los anticuerpos Rh. En una etapa ms avanzada del [place], se harn anlisis de sangre para saber si tiene diabetes, junto con otros estudios si surgen problemas.  Anlisis de orina para detectar infecciones, diabetes o protenas en la orina.  Una ecografa para confirmar que el beb crece y se desarrolla correctamente.  Una amniocentesis para diagnosticar posibles problemas genticos.  Estudios del feto para descartar espina bfida y sndrome de [place].  Es posible que necesite otras pruebas adicionales.  Prueba del VIH (virus de inmunodeficiencia humana). Los exmenes prenatales de rutina incluyen la prueba de deteccin del VIH, a menos que decida no Futures traderrealizrsela. INSTRUCCIONES PARA EL CUIDADO EN EL HOGAR  Medicamentos:  Siga las indicaciones del mdico en relacin con el uso de medicamentos. Durante el embarazo, hay medicamentos que pueden tomarse y otros que no.  Tome las vitaminas prenatales como se le indic.  Si est estreida, tome un laxante suave, si el mdico lo Lib[place] Arab Jamahiriyaautoriza. Dieta  Consuma alimentos balanceados. Elija alimentos variados, como carne o protenas de origen vegetal, pescado, leche y productos lcteos descremados, verduras, frutas y panes y Radiation protection practitionercereales integrales. El mdico la ayudar a Production assistant, radiodeterminar la cantidad de peso que puede Emerald Bayaumentar.  No coma carne cruda ni quesos sin cocinar. Estos elementos contienen bacterias que pueden causar defectos congnitos en el beb.  La ingesta diaria de cuatro o cinco comidas pequeas en lugar de tres comidas abundantes puede ayudar a Yahooaliviar las nuseas y los vmitos. Si empieza a tener nuseas, comer algunas [address] Avenuegalletas saladas puede ser de Sunny Slopesayuda. Beber lquidos National Cityentre las comidas en lugar de tomarlos durante  las comidas tambin puede ayudar a Optician, dispensingcalmar las nuseas y los vmitos.  Si est estreida, consuma alimentos con alto contenido de [place] Junctionfibra, como verduras y frutas frescas, y Radiation protection practitionercereales integrales. Beba suficiente lquido para Photographermantener la orina clara o de color amarillo plido. Actividad y Landscape architectejercicios  Haga ejercicio solamente como se lo haya indicado el mdico. El ejercicio la ayudar a:  Art gallery managerControlar el peso.  Mantenerse en forma.  Estar preparada para el trabajo de parto y [place]el parto.  Los dolores, los clicos en la parte baja del abdomen o los calambres en la cintura son un buen indicio de que debe dejar de Corporate treasurerhacer ejercicios. Consulte al mdico antes de seguir haciendo ejercicios normales.  Intente no estar de pie FedExdurante mucho tiempo. Mueva las piernas con frecuencia si debe estar de pie en un lugar durante mucho tiempo.  Evite levantar pesos Fortune Brandsexcesivos.  Use zapatos de tacones bajos y Brazilmantenga una buena postura.  Puede seguir teniendo The St. Paul Travelersrelaciones sexuales, excepto que el mdico le indique lo contrario. Alivio del dolor o las molestias  Use un sostn que le brinde buen soporte si siente dolor a la palpacin Mattelen las mamas.  Dese baos de asiento con agua tibia para Engineer, materialsaliviar el dolor o las molestias causadas por las hemorroides. Use crema antihemorroidal si el mdico se lo permite.  Descanse con las piernas elevadas si tiene calambres o dolor de cintura.  Si  tiene venas varicosas en las piernas, use medias de descanso. Eleve los pies durante 15minutos, 3 o 4veces por da. Limite la cantidad de sal en su dieta. Cuidados prenatales  Programe las visitas prenatales para la semana12 de [place]mbarazo. Generalmente se programan cada mes al principio y se hacen ms frecuentes en los 2 ltimos meses antes del parto.  Escriba sus preguntas. Llvelas cuando concurra a las visitas prenatales.  Concurra a todas las visitas prenatales como se lo haya indicado el mdico. Seguridad  Colquese el cinturn de  seguridad cuando conduzca.  Haga una lista de los nmeros de telfono de Associate Professoremergencia, que W. R. Berkleyincluya los nmeros de telfono de familiares, [place]uffsamigos, el hospital y los departamentos de polica y bomberos. Consejos generales  Pdale al mdico que la derive a clases de educacin prenatal en su localidad. Debe comenzar a tomar las clases antes de Cytogeneticistentrar en el mes6 de embarazo.  Pida ayuda si tiene necesidades nutricionales o de asesoramiento Academic librariandurante el embarazo. El mdico puede aconsejarla o derivarla a especialistas para que la ayuden con diferentes necesidades.  No se d baos de inmersin en agua caliente, baos turcos ni saunas.  No se haga duchas vaginales ni use tampones o toallas higinicas perfumadas.  No mantenga las piernas cruzadas durante [place] Beth[place]ymucho tiempo.  Evite el contacto con las bandejas sanitarias de los gatos y la tierra que estos animales usan. Estos elementos contienen bacterias que pueden causar defectos congnitos al beb y la posible prdida del feto debido a un aborto espontneo o muerte fetal.  No fume, no consuma hierbas ni medicamentos que no hayan sido recetados por el mdico. Las sustancias qumicas que estos productos contienen afectan la formacin y el desarrollo del beb.  No consuma ningn producto que contenga tabaco, lo que incluye cigarrillos, tabaco de Theatre managermascar y Administrator, Civil Servicecigarrillos electrnicos. Si necesita ayuda para dejar de fumar, consulte al American Expressmdico. Puede recibir asesoramiento y otro tipo de recursos para dejar de fumar.  Programe una cita con el dentista. En su casa, lvese los dientes con un cepillo dental blando y psese el hilo dental con suavidad. SOLICITE ATENCIN MDICA SI:   Tiene mareos.  Siente clicos leves, presin en la pelvis o dolor persistente en el abdomen.  Tiene nuseas, vmitos o diarrea persistentes.  Tiene secrecin vaginal con mal olor.  Siente dolor al ConocoPhillipsorinar.  Tiene el rostro, las Priddymanos, las piernas o los tobillos ms hinchados. SOLICITE  ATENCIN MDICA DE INMEDIATO SI:   Tiene fiebre.  Tiene una prdida de lquido por la vagina.  Tiene sangrado o pequeas prdidas vaginales.  Siente dolor intenso o clicos en el abdomen.  Sube o baja de peso rpidamente.  Vomita sangre de color rojo brillante o material que parezca granos de caf.  Ha estado expuesta a la rubola y no ha sufrido la enfermedad.  Ha estado expuesta a la quinta enfermedad o a la varicela.  Tiene un dolor de cabeza intenso.  Le falta el aire.  Sufre cualquier tipo de traumatismo, por ejemplo, debido a una cada o un accidente automovilstico.   Esta informacin no tiene Theme park managercomo fin reemplazar el consejo del mdico. Asegrese de hacerle al mdico cualquier pregunta que tenga.   Document Released: 10/28/2004 Document Revised: 02/08/2014 Elsevier Interactive Patient Education [employer]2016 Elsevier Inc.

## 2015-10-28 NOTE — Progress Notes (Signed)
Beth Brown is a 35 y.o. [phone] at 5929w0d by LMP who presents for her initial prenatal visit. Pregnancy is planned; however hoped it would be later She reports breast tenderness, morning sickness and nausea. She is Taking PNV. Patient's last menstrual period was 08/12/2015 (exact date).  Had a positive home pregnancy test. Went to [facility] and received testing to confirm pregnancy (form put in scan box).   Past Medical History:  Diagnosis Date  . GERD (gastroesophageal reflux disease)     OB History  Gravida Para Term Preterm AB Living  4 3 3    0 3  SAB TAB Ectopic Multiple Live Births          1    # Outcome Date GA Lbr Len/2nd Weight Sex Delivery Anes PTL Lv  4 Current           3 Term    9 lb (4.082 kg) M Vag-Spont   LIV  2 Term     M Vag-Spont     1 Term     M Vag-Spont        Allergies  Allergen Reactions  . Penicillins Hives    Gyn Hx - normal pap smears FH - No significant FH. Denies any genetic abnormalities or birth defects Meds - only taking PNV Social Hx - married, lives with kids and husband, sister-in-law. Non-smoker. Feels safe in her relationship. Does not drink or do drugs  Prepregnancy weight - ~160lbs  Dep[facility] screen [facility] 2[facility]9 10/28/2015 08/01/2012  Decreased Interest 3 0  Down, Depressed, Hopeless 1 0  PHQ - 2 Score 4 0  Altered sleeping 2 -  Tired, decreased energy 2 -  Change in appetite 2 -  Feeling bad or failure about yourself  1 -  Trouble concentrating 1 -  Moving slowly or fidgety/restless 1 -  Suicidal thoughts 0 -  PHQ-9 Score 13 -    Prenatal exam: See flow sheet for details. Gen: Well nourished, well developed.  No distress.  Vitals noted. HEENT: Normocephalic, atraumatic.  Neck supple without cervical lymphadenopathy, thyromegaly or thyroid nodules. Fair dentition. CV: RRR no murmur, gallops or rubs Lungs: CTAB.  Normal respiratory effort without wheezes or rales. Breasts: bilateral breasts normal in appearance.  No erythema, deformity, or nipple discharge. No palpable abnormal masses. No axillary lymphadenopathy. Abd: soft, NTND. +BS.  Uterus not appreciated above pelvis. GU: Normal external female genitalia without lesions.  Nl vaginal, well rugated without lesions. No vaginal discharge.  Polyp appreciated on cervix. Bimanual exam: No adnexal mass or TTP. No CMT.   Ext: No clubbing, cyanosis or edema. Psych: Normal grooming and dress.  Not depressed or anxious appearing.  Normal thought content and process without flight of ideas or looseness of associations  Assessment/plan: 1) Pregnancy at 3029w0d doing well.  Current pregnancy issues include none.  Dating is reliable Prenatal labs reviewed, no notable findings. Labs unremarkable. Pap smear performed today Received flu vaccine Bleeding and pain precautions reviewed. Handout given Importance of prenatal vitamins reviewed. Sent in Rx to pharmacy.  Genetic screening offered. Will obtain quad screen at next visit.   Early glucola is not indicated.  Need to discuss PHQ-9 results at next visit Please also see pregnancy overview box for additional details.    Follow up 4 weeks.   Caryl AdaJazma Chala Gul, DO 10/28/2015, 1:52 PM PGY-3, [facility]

## 2015-10-29 LAB — CYTOLOGY - PAP

## 2015-10-29 LAB — CERVICOVAGINAL ANCILLARY ONLY
CHLAMYDIA, DNA PROBE: NEGATIVE
NEISSERIA GONORRHEA: NEGATIVE

## 2015-11-26 ENCOUNTER — Telehealth: Payer: Self-pay

## 2015-11-26 ENCOUNTER — Ambulatory Visit (INDEPENDENT_AMBULATORY_CARE_PROVIDER_SITE_OTHER): Payer: Self-pay | Admitting: Obstetrics and Gynecology

## 2015-11-26 VITALS — BP 127/62 | HR 67 | Temp 98.3°F | Wt 160.6 lb

## 2015-11-26 DIAGNOSIS — O26892 Other specified pregnancy related conditions, second trimester: Secondary | ICD-10-CM

## 2015-11-26 DIAGNOSIS — R519 Headache, unspecified: Secondary | ICD-10-CM

## 2015-11-26 DIAGNOSIS — R109 Unspecified abdominal pain: Secondary | ICD-10-CM

## 2015-11-26 DIAGNOSIS — G8929 Other chronic pain: Secondary | ICD-10-CM

## 2015-11-26 DIAGNOSIS — Z3480 Encounter for supervision of other normal pregnancy, unspecified trimester: Secondary | ICD-10-CM

## 2015-11-26 DIAGNOSIS — R002 Palpitations: Secondary | ICD-10-CM

## 2015-11-26 DIAGNOSIS — R51 Headache: Secondary | ICD-10-CM

## 2015-11-26 MED ORDER — PRENATAL VITAMIN 27-0.8 MG PO TABS: 1.0000 | ORAL_TABLET | Freq: Every day | ORAL | 12 refills | Status: DC

## 2015-11-26 MED ORDER — BUTALBITAL-APAP-CAFFEINE 50-325-40 MG PO TABS: 1.0000 | ORAL_TABLET | Freq: Four times a day (QID) | ORAL | 0 refills | Status: DC | PRN

## 2015-11-26 NOTE — Progress Notes (Signed)
Beth Brown is a 35 y.o. [phone] at 2539w1d for routine follow up.  Beth Brown reports:   1. Headache - has been occurring for >41m and is everyday. Had headaches prior top pregnancy but now worse. Has tried tylenol that is not helping.  2. Abdominal Pain - intermitted lower abdominal pain that is central. Denies dysuria. No n/v. Intermittent sharp pain. Occasional cramping.  3. Palpitations - feels her heart racing intermittently. No h/o cardiac disease.   Denies LOF, VB, contractions, vaginal discharge, nausea/vomiting   See flow sheet for details. BP 127/62   Pulse 67   Temp 98.3 F (36.8 C)   Wt 160 lb 9.6 oz (72.8 kg)   LMP 08/12/2015 (Exact Date)   BMI 30.10 kg/m  General: Well-appearing in NAD.  Heart: RRR. Chest: CTAB. No wheezes/crackles. Abdomen:+BS. S, NTND. Uterus palpated below umbilicus  Neurological: No focal deficits. CN grossly intact. Normal strength and sensation   A/P: Pregnancy at 3339w1d.  Doing well.  Pregnancy progressing well.  Pregnancy issues include: 1. Headache - will give Rx for Fioricet to use as needed for headaches. Neuro exam unremarkable.  2. Abdominal Pain - benign abdominal exam. Most likely MSK or round ligament pain related to pregnancy. Counseled patient on warning signs and gave return prevautuions. Handout on abdominal pain in pregnancy provided.  3. Palpitations - HR normal today. Asymptomatic currently. Patient to get pulse ox to monitor HR. DDX includes WPW in pregnancy. If continues to occur need to obtain EKG.   Anatomy ultrasound ordered to be scheduled at 18-19 weeks. [facility] paperwork sent Pt  is not interested in genetic screening. Bleeding and pain precautions reviewed.  Follow up 4 weeks.  Caryl AdaJazma Baylyn Sickles, DO 11/26/2015, 9:39 AM PGY-3, [facility]

## 2015-11-26 NOTE — Patient Instructions (Addendum)
Follow-up appointment in 4 weeks Get a pulse ox and monitor HR for increases >110.    Dolor abdominal durante el embarazo (Abdominal Pain During Pregnancy) El dolor de vientre (abdominal) es habitual durante el embarazo. Generalmente no se trata de un problema grave. Otras veces puede ser un signo de que algo no anda bien. Siempre comunquese con su mdico si tiene dolor abdominal. CUIDADOS EN EL HOGAR Controle el dolor para ver si hay cambios. Las indicaciones que siguen pueden ayudarla a sentirse mejor:  Hospital doctorotenga sexo (relaciones sexuales) ni se coloque nada dentro de la vagina hasta que se sienta mejor.  Haga reposo hasta que el dolor se calme.  Si siente ganas de vomitar (nuseas ) beba lquidos claros. No consuma alimentos slidos hasta que se sienta mejor.  Slo tome los medicamentos que le haya indicado su mdico.  Cumpla con las visitas al mdico segn las indicaciones. SOLICITE AYUDA DE INMEDIATO SI:   Tiene un sangrado, pierde lquido o elimina trozos de tejido por la vagina.  Siente ms dolor o clicos.  Comienza a vomitar.  Siente dolor al orinar u observa sangre en la orina.  Tiene fiebre.  No siente que el beb se mueva mucho.  Se siente muy dbil o cree que va a desmayarse.  Tiene dificultad para respirar con o sin dolor en el vientre.  Siente un dolor de cabeza muy intenso y Engineer, miningdolor en el vientre.  Observa que sale un lquido por la vagina y tiene dolor abdominal.  La materia fecal es lquida (diarrea).  El dolor en el viente no desaparece, o empeora, luego de hacer reposo. ASEGRESE DE QUE:   Comprende estas instrucciones.  Controlar su afeccin.  Recibir ayuda de inmediato si no mejora o si empeora.   Esta informacin no tiene Theme park managercomo fin reemplazar el consejo del mdico. Asegrese de hacerle al mdico cualquier pregunta que tenga.   Document Released: 09/30/2010 Document Revised: 09/20/2012 Elsevier Interactive Patient Education Microsoft2016 Elsevier  Inc.

## 2015-11-26 NOTE — Telephone Encounter (Signed)
[facility] 12/15/2015 at 1100.  Informed patient to show up 15 min early and bring a photo ID and $126.00.Glennie HawkSimpson, Asani Mcburney R

## 2015-12-09 ENCOUNTER — Ambulatory Visit (INDEPENDENT_AMBULATORY_CARE_PROVIDER_SITE_OTHER): Payer: Self-pay | Admitting: Family Medicine

## 2015-12-09 VITALS — BP 119/59 | HR 71 | Temp 98.4°F | Wt 162.2 lb

## 2015-12-09 DIAGNOSIS — L2089 Other atopic dermatitis: Secondary | ICD-10-CM

## 2015-12-09 MED ORDER — TRIAMCINOLONE ACETONIDE 0.1 % EX OINT: 1.0000 "application " | TOPICAL_OINTMENT | Freq: Two times a day (BID) | CUTANEOUS | 0 refills | Status: DC

## 2015-12-09 NOTE — Progress Notes (Signed)
   HPI  CC: Rash  Beth Brown is a 35 y.o. , with the above CC.  1 month itching, 3 weeks of rash present, getting worse. Has noticed papules groin area and on stomach and right arm, but no plaques like the one on her left back.  No history of eczema. No asthma.  No fevers. Tried aloe vera. Tried son's prescription ? Given recently (has recent dx of bullous impetigo and tinea capitus) used only 3 times, did not improve symptoms or look of rash. Severe itching.    The following portions of the patient's history were reviewed and updated as appropriate: allergies, current medications, past family history, past medical history, past social history, past surgical history and problem list.  ROS: A 12-point review of systems was performed and negative, except as stated in the above HPI.  OBJECTIVE BP (!) 119/59   Pulse 71   Temp 98.4 F (36.9 C)   Wt 162 lb 3.2 oz (73.6 kg)   LMP 08/12/2015 (Exact Date)   BMI 30.40 kg/m       Gen: NAD, alert, cooperative, and pleasant. HEENT: NCAT, EOMI, PERRL CV: RRR, no murmur Resp: CTAB, no wheezes, non-labored Abd: SNTND, BS present, no guarding or organomegaly Ext: No edema, warm Neuro: Alert and oriented, Speech clear, No gross deficits Derm: Large eczematous plaque on left mid back (see picture above), with satellite papules around it. Erythematous, purpural, scaling.   ASSESSMENT AND PLAN: 1. Other atopic dermatitis - Will try medium potency topical steroid for 2 weeks - If no improvement or worsens, consider tinea corporis treatment - triamcinolone ointment (KENALOG) 0.1 %; Apply 1 application topically 2 (two) times daily. To affected area on skin for 2-4 weeks.  Dispense: 80 g; Refill: 0    Beth ClarksElizabeth W Darneshia Demary, DO Family Medicine [facility] 12/09/2015 1:58 PM

## 2015-12-09 NOTE — Patient Instructions (Signed)
Eczema Eczema, also called atopic dermatitis, is a skin disorder that causes inflammation of the skin. It causes a red rash and dry, scaly skin. The skin becomes very itchy. Eczema is generally worse during the cooler winter months and often improves with the warmth of summer. Eczema usually starts showing signs in infancy. Some children outgrow eczema, but it may last through adulthood.  CAUSES  The exact cause of eczema is not known, but it appears to run in families. People with eczema often have a family history of eczema, allergies, asthma, or hay fever. Eczema is not contagious. Flare-ups of the condition may be caused by:   Contact with something you are sensitive or allergic to.   Stress. SIGNS AND SYMPTOMS  Dry, scaly skin.   Red, itchy rash.   Itchiness. This may occur before the skin rash and may be very intense.  DIAGNOSIS  The diagnosis of eczema is usually made based on symptoms and medical history. TREATMENT  Eczema cannot be cured, but symptoms usually can be controlled with treatment and other strategies. A treatment plan might include:  Controlling the itching and scratching.   Use over-the-counter antihistamines as directed for itching. This is especially useful at night when the itching tends to be worse.   Use over-the-counter steroid creams as directed for itching.   Avoid scratching. Scratching makes the rash and itching worse. It may also result in a skin infection (impetigo) due to a break in the skin caused by scratching.   Keeping the skin well moisturized with creams every day. This will seal in moisture and help prevent dryness. Lotions that contain alcohol and water should be avoided because they can dry the skin.   Limiting exposure to things that you are sensitive or allergic to (allergens).   Recognizing situations that cause stress.   Developing a plan to manage stress.  HOME CARE INSTRUCTIONS   Only take over-the-counter or  prescription medicines as directed by your health care provider.   Do not use anything on the skin without checking with your health care provider.   Keep baths or showers short (5 minutes) in warm (not hot) water. Use mild cleansers for bathing. These should be unscented. You may add nonperfumed bath oil to the bath water. It is best to avoid soap and bubble bath.   Immediately after a bath or shower, when the skin is still damp, apply a moisturizing ointment to the entire body. This ointment should be a petroleum ointment. This will seal in moisture and help prevent dryness. The thicker the ointment, the better. These should be unscented.   Keep fingernails cut short. Children with eczema may need to wear soft gloves or mittens at night after applying an ointment.   Dress in clothes made of cotton or cotton blends. Dress lightly, because heat increases itching.   A child with eczema should stay away from anyone with fever blisters or cold sores. The virus that causes fever blisters (herpes simplex) can cause a serious skin infection in children with eczema. SEEK MEDICAL CARE IF:   Your itching interferes with sleep.   Your rash gets worse or is not better within 1 week after starting treatment.   You see pus or soft yellow scabs in the rash area.   You have a fever.   You have a rash flare-up after contact with someone who has fever blisters.    This information is not intended to replace advice given to you by your health care   provider. Make sure you discuss any questions you have with your health care provider.   Document Released: 01/16/2000 Document Revised: 11/08/2012 Document Reviewed: 08/21/2012 Elsevier Interactive Patient Education 2016 Elsevier Inc.  

## 2015-12-11 ENCOUNTER — Telehealth: Payer: Self-pay | Admitting: Family Medicine

## 2015-12-11 NOTE — Addendum Note (Signed)
Addended by: Garen GramsBENTON, ASHA F on: 12/11/2015 03:17 PM   Modules accepted: Orders

## 2015-12-11 NOTE — Telephone Encounter (Signed)
Patient was confused about ultrasound appointment because [facility] Monday [facility]-15-15 at 11:00 am. and on the other hand she already has an appointment for an ultrasound at [address] ([facility] Department) on Monday 12-15-15 at 1:30 pm.

## 2015-12-15 ENCOUNTER — Ambulatory Visit (HOSPITAL_COMMUNITY): Payer: Self-pay

## 2015-12-23 ENCOUNTER — Encounter: Payer: Self-pay | Admitting: Family Medicine

## 2015-12-23 ENCOUNTER — Ambulatory Visit (INDEPENDENT_AMBULATORY_CARE_PROVIDER_SITE_OTHER): Payer: Self-pay | Admitting: Obstetrics and Gynecology

## 2015-12-23 VITALS — BP 121/65 | HR 76 | Temp 98.7°F | Wt 166.0 lb

## 2015-12-23 DIAGNOSIS — O99712 Diseases of the skin and subcutaneous tissue complicating pregnancy, second trimester: Secondary | ICD-10-CM

## 2015-12-23 DIAGNOSIS — Z3480 Encounter for supervision of other normal pregnancy, unspecified trimester: Secondary | ICD-10-CM

## 2015-12-23 DIAGNOSIS — L309 Dermatitis, unspecified: Secondary | ICD-10-CM

## 2015-12-23 DIAGNOSIS — L299 Pruritus, unspecified: Secondary | ICD-10-CM

## 2015-12-23 LAB — COMPREHENSIVE METABOLIC PANEL
ALK PHOS: 55 U/L (ref 38–126)
ALT: 20 U/L (ref 14–54)
AST: 22 U/L (ref 15–41)
Albumin: 3.2 g/dL — ABNORMAL LOW (ref 3.5–5.0)
Anion gap: 8 (ref 5–15)
BUN: <5 mg/dL — ABNORMAL LOW (ref 6–20)
CALCIUM: 9.3 mg/dL (ref 8.9–10.3)
CO2: 24 mmol/L (ref 22–32)
CREATININE: 0.32 mg/dL — AB (ref 0.44–1.00)
Chloride: 102 mmol/L (ref 101–111)
GFR calc Af Amer: >60 mL/min (ref >60)
GFR calc non Af Amer: >60 mL/min (ref >60)
Glucose, Bld: 90 mg/dL (ref 65–99)
Potassium: 3.5 mmol/L (ref 3.5–5.1)
SODIUM: 134 mmol/L — AB (ref 135–145)
Total Bilirubin: 0.1 mg/dL — ABNORMAL LOW (ref 0.3–1.2)
Total Protein: 6 g/dL — ABNORMAL LOW (ref 6.5–8.1)

## 2015-12-23 NOTE — Progress Notes (Signed)
Beth Brown is a 35 y.o. [id number] at 5676w0d for routine follow up.  She reports no bleeding, no cramping and no leaking. In addition to the following:  #Rash - recently seen in clinic on 11-7. Was noted to have atopic dermatitis. Was given topical steroid to use. Initial lesion much improved but now patient having scattered erythematous papules diffusely. Also associated itching all over.  See flow sheet for details.  A/P: Pregnancy at 3476w0d.  Doing well.  Pregnancy progressing well.  Pregnancy issues include: 1. Supervision of other normal pregnancy, antepartum Continue routine prenatal care.  Low-risk pregnancy  2. Pruritus of pregnancy in second trimester Patient with diffuse pruritis in pregnancy. Concern for intrahepatic cholestasis of pregnancy. Will collect bile acids and LFTs. Will discuss results with patient when available. If normal will treat with antihistamine.  - Bile acids, total - Comprehensive metabolic panel  3. Dermatitis Continue topical steroid as it is helping. Unsure what is precipitating dermatitis. Appears like an atopic dermatitis. Patient did not have this issue before pregnancy. Will continue to monitor. Discussed avoiding harsh lotions or soaps with fragrances.   Anatomy scan reviewed, problems are not noted.  Preterm labor precautions reviewed. Follow up 4 weeks.   Caryl AdaJazma Phelps, DO 12/23/2015, 12:04 PM PGY-3, [facility]

## 2015-12-23 NOTE — Patient Instructions (Signed)
Will contact you about your blood work Continue the cream for now for skin; keep lubricated. May be worsening in dry weather   Hemorragia vaginal durante el embarazo (segundo trimestre) (Vaginal Bleeding During Pregnancy, Second Trimester) Durante el embarazo, es comn tener una pequea hemorragia vaginal (manchas). A veces, la hemorragia es normal y no representa un problema, pero en algunas ocasiones es un sntoma de algo grave. Asegrese de decirle a su mdico de inmediato si tiene algn tipo de hemorragia vaginal. CUIDADOS EN EL HOGAR  Controle su afeccin para ver si hay cambios.  Siga las indicaciones de su mdico con respecto al Hebrongrado de actividad que Bonitapuede tener.  Si debe hacer reposo en cama:  Es posible que deba quedarse en cama y levantarse nicamente para ir al bao.  Quizs le permitan hacer The PNC Financialalgunas actividades.  Si es necesario, planifique que alguien la ayude.  Marcelino FreestoneEscriba:  La cantidad de toallas higinicas que [place]ausa cada da.  La frecuencia con la que se cambia las toallas higinicas.  Indique que tan empapados (saturados) estn.  No use tampones.  No se haga duchas vaginales.  No tenga relaciones sexuales ni orgasmos hasta que el mdico la autorice.  Si elimina tejido por la vagina, gurdelo para mostrrselo al American Expressmdico.  Tome los medicamentos solamente como se lo haya indicado el mdico.  No tome aspirina, ya que puede causar hemorragias.  No haga ejercicios, no levante objetos pesados ni haga ninguna actividad que exija mucha energa y esfuerzo, salvo que su mdico la autorice.  Concurra a todas las visitas de control como se lo haya indicado el mdico. SOLICITE AYUDA SI:  Tiene una hemorragia vaginal.  Tiene clicos.  Tiene dolores de Scioparto.  Tiene fiebre que no desaparece despus de Teacher, adult educationtomar medicamentos. SOLICITE AYUDA DE INMEDIATO SI:  Siente clicos muy intensos en la espalda o en el vientre (abdomen).  Siente contracciones.  Tiene  escalofros.  Elimina cogulos grandes o tejido por la vagina.  Tiene ms hemorragia.  Se siente dbil o que va a desvanecerse.  Pierde el conocimiento (se desmaya).  Tiene una prdida importante o sale lquido a borbotones por la vagina. ASEGRESE DE QUE:  Comprende estas instrucciones.  Controlar su afeccin.  Recibir ayuda de inmediato si no mejora o si empeora. Esta informacin no tiene Theme park managercomo fin reemplazar el consejo del mdico. Asegrese de hacerle al mdico cualquier pregunta que tenga. Document Released: 06/04/2013 Document Revised: 06/04/2013 Document Reviewed: 09/25/2012 Elsevier Interactive Patient Education  2017 [employer].

## 2015-12-24 LAB — BILE ACIDS, TOTAL: Bile Acids Total: 11 umol/L (ref 4.7–24.5)

## 2016-01-01 ENCOUNTER — Telehealth: Payer: Self-pay | Admitting: Family Medicine

## 2016-01-01 NOTE — Telephone Encounter (Signed)
Ob Provider Doroteo Glassmanhelps, will forward to blue team

## 2016-01-01 NOTE — Telephone Encounter (Signed)
[place]: Beth Brown [phone]- Information relayed to the pt and pt voiced understanding.

## 2016-01-01 NOTE — Telephone Encounter (Signed)
Patient asks about Lab results, they were done on 12-23-15. Please, follow up.

## 2016-01-01 NOTE — Telephone Encounter (Signed)
Please follow-up with patient and let her know that her lab results were reassuring/normal and no abnormalities at this time to cause her itching. She can take the benadryl as we discussed to help with itching.

## 2016-01-20 ENCOUNTER — Ambulatory Visit (INDEPENDENT_AMBULATORY_CARE_PROVIDER_SITE_OTHER): Payer: Self-pay | Admitting: Family Medicine

## 2016-01-20 ENCOUNTER — Encounter: Payer: Self-pay | Admitting: Family Medicine

## 2016-01-20 VITALS — BP 102/58 | HR 92 | Temp 99.1°F | Wt 167.6 lb

## 2016-01-20 DIAGNOSIS — L2089 Other atopic dermatitis: Secondary | ICD-10-CM

## 2016-01-20 DIAGNOSIS — Z3492 Encounter for supervision of normal pregnancy, unspecified, second trimester: Secondary | ICD-10-CM

## 2016-01-20 MED ORDER — TRIAMCINOLONE ACETONIDE 0.1 % EX OINT: 1.0000 "application " | TOPICAL_OINTMENT | Freq: Two times a day (BID) | CUTANEOUS | 0 refills | Status: DC

## 2016-01-20 MED ORDER — FLUTICASONE PROPIONATE 50 MCG/ACT NA SUSP: 2.0000 | Freq: Every day | NASAL | 6 refills | Status: DC

## 2016-01-20 MED ORDER — DIPHENHYDRAMINE HCL 25 MG PO CAPS: 25.0000 mg | ORAL_CAPSULE | Freq: Every day | ORAL | 0 refills | Status: DC

## 2016-01-20 MED ORDER — LORATADINE 10 MG PO TABS: 10.0000 mg | ORAL_TABLET | Freq: Every day | ORAL | 2 refills | Status: DC

## 2016-01-20 NOTE — Patient Instructions (Addendum)
Thank you for coming in today, it was so nice to see you! Today we talked about:    Rash: Please use the cream as prescribed. Please use the claritin during the day and benadryl at night. Continue to use lotion for moisturizing.   Cold: I have sent a prescription for Flonase to your pharmacy  Please follow up in 4 weeks. You can schedule this appointment at the front desk before you leave or call the clinic.  If you have any questions or concerns, please do not hesitate to call the office at [phone]. You can also message me directly via MyChart.   Sincerely,  Anders Simmondshristina Florrie Ramires, MD   Eczema (Eczema) El eczema, tambin llamada dermatitis atpica, es una afeccin de la piel que causa inflamacin de la misma. Este trastorno produce una erupcin roja y sequedad y escamas en la piel. Hay gran picazn. El eczema generalmente empeora durante los meses fros del invierno y generalmente desaparece o mejora con el tiempo clido del verano. El eczema generalmente comienza a manifestarse en la infancia. Algunos nios desarrollan este trastorno y ste puede prolongarse en la Estate manager/land agentadultez. CAUSAS La causa exacta no se conoce pero parece ser una afeccin hereditaria. Generalmente las personas que sufren eczema tienen una historia familiar de eczema, alergias, asma o fiebre de heno. Esta enfermedad no es contagiosa. Algunas causas de los brotes pueden ser:  Contacto con alguna cosa a la que es sensible o Best boyalrgico.  Librarian, academicstrs. SIGNOS Y SNTOMAS  Piel seca y escamosa.  Erupcin roja y que pica.  Picazn. Esta puede ocurrir antes de que aparezca la erupcin y puede ser muy intensa. DIAGNSTICO El diagnstico de eczema se realiza basndose en los sntomas y en la historia clnica. TRATAMIENTO El eczema no puede curarse, pero los sntomas generalmente pueden controlarse con tratamiento y Development worker, communityotras estrategias. Un plan de tratamiento puede incluir:  Control de la picazn y el rascado.  Utilice  antihistamnicos de venta libre segn las indicaciones, para Associate Professoraliviar la picazn. Es especialmente til por las noches cuando la picazn tiende a Theme park managerempeorar.  Utilice medicamentos de venta libre para la picazn, segn las indicaciones del mdico.  Evite rascarse. El rascado hace que la picazn empeore. Tambin puede producir una infeccin en la piel (imptigo) debido a las lesiones en la piel causadas por el rascado.  Mantenga la piel bien humectada con cremas, todos Madelialos das. La piel quedar hmeda y ayudar a prevenir la sequedad. Las lociones que contengan alcohol y agua deben evitarse debido a que pueden Best boysecar la piel.  Limite la exposicin a las cosas a las que es sensible o alrgico (alrgenos).  Reconozca las situaciones que puedan causar estrs.  Desarrolle un plan para controlar el estrs. INSTRUCCIONES PARA EL CUIDADO EN EL HOGAR  Tome slo medicamentos de venta libre o recetados, segn las indicaciones del mdico.  No aplique nada sobre la piel sin Science writerconsultar a su mdico.  Deber tomar baos o duchas de corta duracin (5 minutos) en agua tibia (no caliente). Use jabones suaves para el bao. No deben tener perfume. Puede agregar aceite de bao no perfumado al agua del bao. Es Manufacturing engineermejor evitar el jabn y el bao de espuma.  Inmediatamente despus del bao o de la ducha, cuando la piel aun est hmeda, aplique una crema humectante en todo el cuerpo. Este ungento debe ser en base a vaselina. La piel quedar hmeda y ayudar a prevenir la sequedad. Cuanto ms espeso sea el ungento, mejor. No deben tener perfume.  Mantenga  las uas cortas. Es posible que los nios con eczema necesiten usar guantes o mitones por la noche, despus de aplicarse el ungento.  Vista al McGraw-Hillnio con ropa de algodn o Chief of Staffmezcla de algodn. Vstalo con ropas ligeras ya que el calor aumenta la picazn.  Un nio con eczema debe permanecer alejado de personas que tengan ampollas febriles o llagas del resfro. El virus que  causa las ampollas febriles (herpes simple) puede ocasionar una infeccin grave en la piel de los nios que padecen eczema. SOLICITE ATENCIN MDICA SI:  La picazn le impide dormir.  La erupcin empeora o no mejora dentro de la semana en la que se inicia el Bridgewatertratamiento.  Observa pus o costras amarillas en la zona de la erupcin.  Tiene fiebre.  Aparece un brote despus de haber estado en contacto con alguna persona que tiene ampollas febriles. Esta informacin no tiene Theme park managercomo fin reemplazar el consejo del mdico. Asegrese de hacerle al mdico cualquier pregunta que tenga. Document Released: 01/18/2005 Document Revised: 11/08/2012 Document Reviewed: 08/21/2012 Elsevier Interactive Patient Education  2017 [employer]vier Inc.

## 2016-01-20 NOTE — Progress Notes (Signed)
Beth Brown is a 35 y.o. [phone] at 5770w0d for routine follow up.  She reports no bleeding, no cramping and no leaking. Positive fetal movement. In addition to the following:  Rash: Patient has had a itchy rash for about 1 month. CMP and bile acids were previous collected and normal. She has been trying triamcinolone prescription cream which has not provided any relief. She notes that the itching is worse at night when compared to the daytime. The rash is on her abdomen, legs, and arms. No one else in the house has a similar rash. No new detergents or soaps.   Cold: Has had runny nose for the last day. Denies any fevers, chills, cough, sore throat, shortness of breath.  General: In NAD, well developed, well nourished HEENT: Nasal mucosal edema of bilateral nares with clear rhinorrhea Skin: Erythematous papular rash with excoriations on bilateral thighs, abdomen, lower back, and arms    See flow sheet for details.  A/P: Pregnancy at 5270w0d. Doing well.  Rash: Unclear etiology but likely eczematous in nature. Additionally bile acids and CMP from 11/21 within normal limits. Does not appear infectious at this time. Had preceptor Dr. Omer JackMumaw examine patient as well. We decided to try Triamcinolone cream BID, Claritin daily, and benadryl at night. Could consider redrawing bile acids or increasing potency of steroid cream if patient continues to itch.  Cold: Flonase, rest, fluids, Tylenol PRN Pregnancy issues include rash as above Anatomy scan reviewed, problems are not noted.  Preterm labor precautions reviewed. Follow up 4 weeks.

## 2016-02-02 NOTE — L&D Delivery Note (Signed)
Delivery Note At 10:03 PM a viable female was delivered via Vaginal, Spontaneous Delivery (Presentation: LOA). No nuchal cord present. Patient found to have a shoulder dystocia. Patient placed in McRoberts position, then suprapubic pressure applied. Left posterior arm was delivered then anterior shoulder delivered easily. One and a half minute shoulder dystocia. Delayed cord clamping.  APGAR: 9, 9.  weight  Pending. Placenta status: spontaneously  Cord: 3V with the following complications: .  Cord pH: pending. Patient did have small amount of  bleeding after delivery so cytotec rectally given.  Anesthesia:  None Episiotomy: None Lacerations: None Suture Repair: None Est. Blood Loss (mL):  300  Mom to postpartum.  Baby to Couplet care / Skin to Skin.  Durenda Hurt 05/24/2016, 10:34 PM

## 2016-02-12 ENCOUNTER — Encounter (HOSPITAL_COMMUNITY): Payer: Self-pay | Admitting: *Deleted

## 2016-02-12 ENCOUNTER — Inpatient Hospital Stay (HOSPITAL_COMMUNITY)
Admission: AD | Admit: 2016-02-12 | Discharge: 2016-02-12 | Disposition: A | Discharge status: Home / Self Care | Payer: Self-pay | Source: Ambulatory Visit | Attending: Obstetrics and Gynecology | Admitting: Obstetrics and Gynecology

## 2016-02-12 DIAGNOSIS — R109 Unspecified abdominal pain: Secondary | ICD-10-CM

## 2016-02-12 DIAGNOSIS — O26892 Other specified pregnancy related conditions, second trimester: Secondary | ICD-10-CM | POA: Insufficient documentation

## 2016-02-12 DIAGNOSIS — L309 Dermatitis, unspecified: Secondary | ICD-10-CM | POA: Insufficient documentation

## 2016-02-12 DIAGNOSIS — O4702 False labor before 37 completed weeks of gestation, second trimester: Secondary | ICD-10-CM

## 2016-02-12 DIAGNOSIS — O26899 Other specified pregnancy related conditions, unspecified trimester: Secondary | ICD-10-CM

## 2016-02-12 DIAGNOSIS — R102 Pelvic and perineal pain: Secondary | ICD-10-CM | POA: Insufficient documentation

## 2016-02-12 DIAGNOSIS — K219 Gastro-esophageal reflux disease without esophagitis: Secondary | ICD-10-CM | POA: Insufficient documentation

## 2016-02-12 DIAGNOSIS — N949 Unspecified condition associated with female genital organs and menstrual cycle: Secondary | ICD-10-CM

## 2016-02-12 DIAGNOSIS — O99612 Diseases of the digestive system complicating pregnancy, second trimester: Secondary | ICD-10-CM | POA: Insufficient documentation

## 2016-02-12 DIAGNOSIS — Z3A26 26 weeks gestation of pregnancy: Secondary | ICD-10-CM | POA: Insufficient documentation

## 2016-02-12 HISTORY — DX: Dermatitis, unspecified: L30.9

## 2016-02-12 LAB — URINALYSIS, ROUTINE W REFLEX MICROSCOPIC
Bilirubin Urine: NEGATIVE
GLUCOSE, UA: NEGATIVE mg/dL
KETONES UR: NEGATIVE mg/dL
Nitrite: NEGATIVE
PROTEIN: NEGATIVE mg/dL
Specific Gravity, Urine: 1.013 (ref 1.005–1.030)
pH: 7 (ref 5.0–8.0)

## 2016-02-12 LAB — WET PREP, GENITAL
CLUE CELLS WET PREP: NONE SEEN
Sperm: NONE SEEN
Trich, Wet Prep: NONE SEEN
YEAST WET PREP: NONE SEEN

## 2016-02-12 LAB — OB RESULTS CONSOLE GBS: STREP GROUP B AG: POSITIVE

## 2016-02-12 MED ORDER — COMFORT FIT MATERNITY SUPP MED MISC: 1.0000 | Freq: Every day | 0 refills | Status: DC

## 2016-02-12 NOTE — MAU Note (Signed)
feeling a little cramping.  4 days ago was feeling a lot of pressure, not feeling it now.  Cramping continues. No hx of PTL. No bleeding or leaking.

## 2016-02-12 NOTE — Discharge Instructions (Signed)
Dolor abdominal durante el embarazo  (Abdominal Pain During Pregnancy)  El dolor de vientre (abdominal) es habitual durante el embarazo. Generalmente no se trata de un problema grave. Otras veces puede ser un signo de que algo no anda bien. Siempre comuníquese con su médico si tiene dolor abdominal.  CUIDADOS EN EL HOGAR  Controle el dolor para ver si hay cambios. Las indicaciones que siguen pueden ayudarla a sentirse mejor:  · Notenga sexo (relaciones sexuales) ni se coloque nada dentro de la vagina hasta que se sienta mejor.  · Haga reposo hasta que el dolor se calme.  · Si siente ganas de vomitar (náuseas ) beba líquidos claros. No consuma alimentos sólidos hasta que se sienta mejor.  · Sólo tome los medicamentos que le haya indicado su médico.  · Cumpla con las visitas al médico según las indicaciones.  SOLICITE AYUDA DE INMEDIATO SI:  · Tiene un sangrado, pierde líquido o elimina trozos de tejido por la vagina.  · Siente más dolor o cólicos.  · Comienza a vomitar.  · Siente dolor al orinar u observa sangre en la orina.  · Tiene fiebre.  · No siente que el bebé se mueva mucho.  · Se siente muy débil o cree que va a desmayarse.  · Tiene dificultad para respirar con o sin dolor en el vientre.  · Siente un dolor de cabeza muy intenso y dolor en el vientre.  · Observa que sale un líquido por la vagina y tiene dolor abdominal.  · La materia fecal es líquida (diarrea).  · El dolor en el viente no desaparece, o empeora, luego de hacer reposo.    ASEGÚRESE DE QUE:  · Comprende estas instrucciones.  · Controlará su afección.  · Recibirá ayuda de inmediato si no mejora o si empeora.    Esta información no tiene como fin reemplazar el consejo del médico. Asegúrese de hacerle al médico cualquier pregunta que tenga.  Document Released: 09/30/2010 Document Revised: 05/12/2015 Document Reviewed: 08/17/2012  Elsevier Interactive Patient Education © 2017 Elsevier Inc.

## 2016-02-12 NOTE — MAU Provider Note (Signed)
History     CSN: [id number]  Arrival date and time: 02/12/16 1145   None     Chief Complaint  Patient presents with  . Abdominal Cramping   [id number] here with lower abdominal cramping and contractions. She reports the pain starting about 1 month ago but has worsened in the last 2-3 weeks ago. Usually the ctx are irregular and not strong but last night they were regular q2-3 min for total of 20 minutes. She reports pelvic pressure 1 weeks. She also c/o low back pain. She describes this as mostly in the morning, midlevel of the back, and intermittent lasting about 5-6 minutes, this started 3 weeks ago. She says the pain starts in the middle (at her spine) and radiates to both sides. She reports one strong contraction during recent IC 2 days ago. She denies VB or LOF. She reports good FM. Review of records reveals pregnancy has been uncomplicated to date.   OB History    Gravida Para Term Preterm AB Living   4 3 3    0 3   SAB TAB Ectopic Multiple Live Births           1      Past Medical History:  Diagnosis Date  . Eczema   . GERD (gastroesophageal reflux disease)     Past Surgical History:  Procedure Laterality Date  . NO PAST SURGERIES      History reviewed. No pertinent family history.  Social History  Substance Use Topics  . Smoking status: Never Smoker  . Smokeless tobacco: Never Used  . Alcohol use No    Allergies:  Allergies  Allergen Reactions  . Penicillins Hives    Has patient had a PCN reaction causing immediate rash, facial/tongue/throat swelling, SOB or lightheadedness with hypotension: Yes Has patient had a PCN reaction causing severe rash involving mucus membranes or skin necrosis: Yes Has patient had a PCN reaction that required hospitalization No Has patient had a PCN reaction occurring within the last 10 years: No If all of the above answers are "NO", then may proceed with Cephalosporin use.    Prescriptions Prior to Admission  Medication Sig  Dispense Refill Last Dose  . loratadine (CLARITIN) 10 MG tablet Take 1 tablet (10 mg total) by mouth daily. 30 tablet 2 02/12/2016 at Unknown time  . Prenatal Vit-Fe Fumarate-FA (PRENATAL VITAMIN) 27-0.8 MG TABS Take 1 tablet by mouth daily. 30 tablet 12 02/11/2016 at Unknown time  . triamcinolone ointment (KENALOG) 0.1 % Apply 1 application topically 2 (two) times daily. To affected area on skin for 2-4 weeks. 80 g 0 02/12/2016 at Unknown time  . butalbital-acetaminophen-caffeine (FIORICET, ESGIC) 50-325-40 MG tablet Take 1 tablet by mouth every 6 (six) hours as needed for headache. (Patient not taking: Reported on 02/12/2016) 30 tablet 0 Not Taking at Unknown time  . diphenhydrAMINE (BENADRYL) 25 mg capsule Take 1 capsule (25 mg total) by mouth at bedtime. (Patient not taking: Reported on 02/12/2016) 30 capsule 0 Not Taking at Unknown time  . fluticasone (FLONASE) 50 MCG/ACT nasal spray Place 2 sprays into both nostrils daily. (Patient not taking: Reported on 02/12/2016) 16 g 6 Not Taking at Unknown time    Review of Systems  Constitutional: Negative for chills and fever.  Gastrointestinal: Positive for abdominal pain, constipation (last BM this am, hard stool) and nausea (yesterday). Negative for diarrhea.  Genitourinary: Negative for dysuria, frequency, hematuria, urgency, vaginal bleeding and vaginal discharge.  Neurological: Positive for headaches (yesterday).   Physical  Exam   Blood pressure 127/66, pulse 79, temperature 98.7 F (37.1 C), temperature source Oral, resp. rate 18, weight 76.4 kg (168 lb 8 oz), last menstrual period 08/12/2015.  Physical Exam  Nursing note and vitals reviewed. Constitutional: She is oriented to person, place, and time. She appears well-developed and well-nourished. No distress (appears comfortable).  HENT:  Head: Normocephalic.  Neck: Normal range of motion.  Cardiovascular: Normal rate.   Respiratory: Effort normal.  GI: Soft. She exhibits no distension.  There is no tenderness. There is no rebound and no guarding.  gravid  Genitourinary:  Genitourinary Comments: External: no lesions or erythema Vagina: rugated, parous, thin white discharge SVE: closed/thick  Musculoskeletal: Normal range of motion.  Neurological: She is alert and oriented to person, place, and time.  Skin: Skin is warm and dry.  Psychiatric: She has a normal mood and affect.   EFM: 140 bpm, mod variability, + accels, no decels Toco: none  Results for orders placed or performed during the hospital encounter of 02/12/16 (from the past 24 hour(s))  Urinalysis, Routine w reflex microscopic     Status: Abnormal   Collection Time: 02/12/16 12:10 PM  Result Value Ref Range   Color, Urine YELLOW YELLOW   APPearance CLOUDY (A) CLEAR   Specific Gravity, Urine 1.013 1.005 - 1.030   pH 7.0 5.0 - 8.0   Glucose, UA NEGATIVE NEGATIVE mg/dL   Hgb urine dipstick MODERATE (A) NEGATIVE   Bilirubin Urine NEGATIVE NEGATIVE   Ketones, ur NEGATIVE NEGATIVE mg/dL   Protein, ur NEGATIVE NEGATIVE mg/dL   Nitrite NEGATIVE NEGATIVE   Leukocytes, UA TRACE (A) NEGATIVE   RBC / HPF 6-30 0 - 5 RBC/hpf   WBC, UA 0-5 0 - 5 WBC/hpf   Bacteria, UA RARE (A) NONE SEEN   Squamous Epithelial / LPF 6-30 (A) NONE SEEN   Mucous PRESENT   Wet prep, genital     Status: Abnormal   Collection Time: 02/12/16  2:25 PM  Result Value Ref Range   Yeast Wet Prep HPF POC NONE SEEN NONE SEEN   Trich, Wet Prep NONE SEEN NONE SEEN   Clue Cells Wet Prep HPF POC NONE SEEN NONE SEEN   WBC, Wet Prep HPF POC FEW (A) NONE SEEN   Sperm NONE SEEN    MAU Course  Procedures  MDM Labs ordered and reviewed. No evidence today of pelvic infection, UTI or PTL. Pain likely d/t dehydration and/or physiologic to grand multip. Stable for discharge home.  Assessment and Plan   1. [redacted] weeks gestation of pregnancy   2. Preterm uterine contractions in second trimester, antepartum   3. Cramping affecting pregnancy, antepartum    4. Pelvic pressure in pregnancy, antepartum, second trimester    Discharge home Rx maternity support belt Tylenol prn Maintain good hydration Warm bath prn  Allergies as of 02/12/2016      Reactions   Penicillins Hives   Has patient had a PCN reaction causing immediate rash, facial/tongue/throat swelling, SOB or lightheadedness with hypotension: Yes Has patient had a PCN reaction causing severe rash involving mucus membranes or skin necrosis: Yes Has patient had a PCN reaction that required hospitalization No Has patient had a PCN reaction occurring within the last 10 years: No If all of the above answers are "NO", then may proceed with Cephalosporin use.      Medication List    STOP taking these medications   butalbital-acetaminophen-caffeine 50-325-40 MG tablet Commonly known as:  FIORICET, ESGIC  diphenhydrAMINE 25 mg capsule Commonly known as:  BENADRYL   fluticasone 50 MCG/ACT nasal spray Commonly known as:  FLONASE     TAKE these medications   COMFORT FIT MATERNITY SUPP MED Misc 1 Device by Does not apply route daily.   loratadine 10 MG tablet Commonly known as:  CLARITIN Take 1 tablet (10 mg total) by mouth daily.   Prenatal Vitamin 27-0.8 MG Tabs Take 1 tablet by mouth daily.   triamcinolone ointment 0.1 % Commonly known as:  KENALOG Apply 1 application topically 2 (two) times daily. To affected area on skin for 2-4 weeks.      Donette Larry, CNM 02/12/2016, 1:41 PM

## 2016-02-12 NOTE — MAU Note (Signed)
Ras noted on rt side of abd, states has had since early in preg, ? Eczema per MD

## 2016-02-13 LAB — CULTURE, OB URINE

## 2016-02-13 LAB — GC/CHLAMYDIA PROBE AMP (~~LOC~~) NOT AT ARMC
Chlamydia: NEGATIVE
NEISSERIA GONORRHEA: NEGATIVE

## 2016-02-17 ENCOUNTER — Encounter: Payer: Self-pay | Admitting: Family Medicine

## 2016-02-17 ENCOUNTER — Ambulatory Visit (INDEPENDENT_AMBULATORY_CARE_PROVIDER_SITE_OTHER): Payer: Self-pay | Admitting: Family Medicine

## 2016-02-17 VITALS — BP 108/68 | HR 72 | Temp 97.5°F | Wt 171.2 lb

## 2016-02-17 DIAGNOSIS — O2686 Pruritic urticarial papules and plaques of pregnancy (PUPPP): Secondary | ICD-10-CM

## 2016-02-17 DIAGNOSIS — Z3480 Encounter for supervision of other normal pregnancy, unspecified trimester: Secondary | ICD-10-CM

## 2016-02-17 DIAGNOSIS — L2089 Other atopic dermatitis: Secondary | ICD-10-CM

## 2016-02-17 LAB — POCT 1 HR PRENATAL GLUCOSE: Glucose 1 Hr Prenatal, POC: 122 mg/dL

## 2016-02-17 MED ORDER — TRIAMCINOLONE ACETONIDE 0.1 % EX OINT: 1.0000 "application " | TOPICAL_OINTMENT | Freq: Three times a day (TID) | CUTANEOUS | 0 refills | Status: DC

## 2016-02-17 NOTE — Progress Notes (Signed)
Beth MoleMaria DEJESUS Brown is a 36 y.o. [phone] at 7838w0d for routine follow up.  She reports positive fetal movements. Denies any contractions, vaginal bleeding, vaginal discharge, nausea, vomiting.  See flow sheet for details.  Rash: Patient states her rash that started around 2 months of pregnancy is even worse as it has spread to her extremities. She has tried the triamcinolone cream twice daily on the affected areas and has not provided any relief. She has also been taking Claritin as well. She notes that this rash is very itchy. Denies any new medicines, new soaps/detergents, no blood on her sheets, no one else in the house has this rash.  PE: Skin: Erythematous papular rash on abdomen, legs, arms. On abdomen and there are notable erythematous areas along striae, none on umbilicus. No discharge noted from the papules although there are some excoriations seen.  A/P: Pregnancy at 4638w0d.  Doing well. 1 hr GTT performed today and it was normal.  Pregnancy issues include: Rash: Worsening. Likely PUPPs vs eczema. Triamcinolone his provided little to no relief, will try 3 times a day triamcinolone as discussed with Dr. Jennette KettleNeal. Maryclare LabradorWe'll also redraw bile acids to ensure this is not cholestasis although the appearance of the rash is more consistent with something else. Preterm labor precautions reviewed. Follow up 2 weeks. Will need Hgb/hct, RPR, and HIV labs at that time.

## 2016-02-17 NOTE — Patient Instructions (Addendum)
Please come back in 2 weeks. Take care!   Tercer trimestre de Psychiatristembarazo (Third Trimester of Pregnancy) El tercer trimestre comprende desde la semana29 hasta la semana42, es decir, desde el mes7 hasta el [address]. En este trimestre, el feto crece muy rpido. Hacia el final del noveno mes, el feto mide alrededor de 20pulgadas (45cm) de largo y pesa entre 6y 10libras [phone](2,700y 4,500kg). CUIDADOS EN EL HOGAR  No fume, no consuma hierbas ni beba alcohol. No tome frmacos que el mdico no haya autorizado.  No consuma ningn producto que contenga tabaco, lo que incluye cigarrillos, tabaco de Theatre managermascar o Administrator, Civil Servicecigarrillos electrnicos. Si necesita ayuda para dejar de fumar, consulte al American Expressmdico. Puede recibir asesoramiento u otro tipo de apoyo para dejar de fumar.  Tome los medicamentos solamente como se lo haya indicado el mdico. Algunos medicamentos son seguros para tomar durante el Psychiatristembarazo y otros no lo son.  Haga ejercicios solamente como se lo haya indicado el mdico. Interrumpa la actividad fsica si comienza a tener calambres.  Ingiera alimentos saludables de Courtlandmanera regular.  Use un sostn que le brinde buen soporte si sus mamas estn sensibles.  No se d baos de inmersin en agua caliente, baos turcos ni saunas.  Colquese el cinturn de seguridad cuando conduzca.  No coma carne cruda ni queso sin cocinar; evite el contacto con las bandejas sanitarias de los gatos y la tierra que estos animales usan.  Tome las vitaminas prenatales.  Tome entre 1500 y 2000mg  de calcio diariamente comenzando en la semana20 del embarazo Franklinhasta el parto.  Pruebe tomar un medicamento que la ayude a defecar (un laxante suave) si el mdico lo autoriza. Consuma ms fibra, que se encuentra en las frutas y verduras frescas y los cereales integrales. Beba suficiente lquido para mantener el pis (orina) claro o de color amarillo plido.  Dese baos de asiento con agua tibia para Engineer, materialsaliviar el dolor o las molestias  causadas por las hemorroides. Use una crema para las hemorroides si el mdico la autoriza.  Si se le hinchan las venas (venas varicosas), use medias de descanso. Levante (eleve) los pies durante 15minutos, 3 o 4veces por Futures traderda. Limite el consumo de sal en su dieta.  No levante objetos pesados, use zapatos de tacones bajos y sintese derecha.  Descanse con las piernas elevadas si tiene calambres o dolor de cintura.  Visite a su dentista si no lo ha Occupational hygienisthecho durante el embarazo. Use un cepillo de cerdas suaves para cepillarse los dientes. Psese el hilo dental con suavidad.  Puede seguir Calpine Corporationmanteniendo relaciones sexuales, a menos que el mdico le indique lo contrario.  No haga viajes de larga distancia, excepto si es obligatorio y solamente con la aprobacin del mdico.  Tome clases prenatales.  Practique ir manejando al hospital.  Prepare el bolso que llevar al hospital.  Prepare la habitacin del beb.  Concurra a los controles mdicos. SOLICITE AYUDA SI:  No est segura de si est en trabajo de parto o si ha roto la bolsa de las aguas.  Tiene mareos.  Siente calambres leves o presin en la parte inferior del abdomen.  Sufre un dolor persistente en el abdomen.  Tiene Programme researcher, broadcasting/film/videomalestar estomacal (nuseas), vmitos, o tiene deposiciones acuosas (diarrea).  Advierte un olor ftido que proviene de la vagina.  Siente dolor al ConocoPhillipsorinar. SOLICITE AYUDA DE INMEDIATO SI:  Tiene fiebre.  Tiene una prdida de lquido por la vagina.  Tiene sangrado o pequeas prdidas vaginales.  Siente dolor intenso o clicos en el abdomen.  Sube o baja de peso rpidamente.  Tiene dificultades para recuperar el aliento y siente dolor en el pecho.  Sbitamente se le hinchan mucho el rostro, las [place], los tobillos, los pies o las piernas.  No ha sentido los movimientos del beb durante Georgianne Fick.  Siente un dolor de cabeza intenso que no se alivia con medicamentos.  Su visin se modifica. Esta  informacin no tiene Theme park manager el consejo del mdico. Asegrese de hacerle al mdico cualquier pregunta que tenga. Document Released: 09/20/2012 Document Revised: 02/08/2014 Document Reviewed: 03/21/2012 Elsevier Interactive Patient Education  2017 Elsevier Inc.   Ppulas y placas pruriginosas y urticariformes del embarazo (Pruritic Urticarial Papules and Plaques of Pregnancy) Con el embarazo, el cuerpo cambia de [place]. Esto incluye la piel. A veces aparecen erupciones. La erupcin de la piel ms frecuente durante el embarazo son las ppulas y placas pruriginosas y urticariformes del Psychiatrist (PUPPP). Los pequeos granos rojos a Hospital doctor. Causan mucha picazn. La erupcin suele aparecer en las ltimas semanas de [employer], durante el tercer trimestre. A veces, puede ocurrir poco despus de dar a luz. Desaparece poco despus del nacimiento del beb. No le causar ningn dao a usted ni a su beb y no dejar cicatrices en la piel. Es ms frecuente en Engineer, agricultural y en los embarazos mltiples. Generalmente no vuelve a Youth worker. CAUSAS La causa es desconocida. Sin embargo puede relacionarse con el rpido estiramiento de la piel debido al [employer]. SNTOMAS Los sntomas del PUPPP son Neomia Dear erupcin que pica mucho. La erupcin est roja y elevada, y se observa con ms frecuencia en el abdomen. Puede extenderse a las piernas, muslos o brazos. A veces hay pequeas ampollas en el centro de las manchas. La piel alrededor de la erupcin suele ser plida. DIAGNSTICO Para diagnosticar PUPPP, el mdico le preguntar acerca de sus sntomas y le har un examen fsico. Posiblemente le indique anlisis de sangre para descartar otras causas de erupcin. TRATAMIENTO El objetivo es calmar la picazn y evitar que la erupcin se propague. Por lo general se indica una crema. Sin embargo, Chemical engineer. Las opciones ms frecuentes son los  medicamentos que alivian o disminuyen la picazn. Algunos medicamentos pueden administrase en forma de crema o ungento, mientras otros se toman por boca (va oral). Los medicamentos pueden ser corticoides o antihistamnicos. El tratamiento es [employer] en casi todas las mujeres con esta erupcin. Las cremas o pldoras deben mejorar la piel rpidamente. INSTRUCCIONES PARA EL CUIDADO EN EL HOGAR  Tome slo medicamentos de venta libre o recetados, segn las indicaciones del mdico.  Aplique las cremas como le indic el mdico.  No se rasque.  Use ropas sueltas.  Cumpla con todas las visitas de control, segn le indique su mdico. SOLICITE ATENCIN MDICA SI:  La picazn y la erupcin no desaparecen despus del tratamiento.  Su erupcin contina extendindose a Designer, industrial/product.  No puede dormir debido a Youth worker. Esta informacin no tiene Theme park manager el consejo del mdico. Asegrese de hacerle al mdico cualquier pregunta que tenga. Document Released: 01/05/2012 Document Revised: 09/20/2012 Document Reviewed: 07/02/2012 Elsevier Interactive Patient Education  2017 [employer].

## 2016-02-18 ENCOUNTER — Other Ambulatory Visit: Payer: Self-pay

## 2016-02-25 ENCOUNTER — Other Ambulatory Visit: Payer: Self-pay

## 2016-02-25 DIAGNOSIS — Z3480 Encounter for supervision of other normal pregnancy, unspecified trimester: Secondary | ICD-10-CM

## 2016-02-25 NOTE — Addendum Note (Signed)
Addended by: Jennette BillBUSICK, Sheresa Cullop L on: 02/25/2016 08:37 AM   Modules accepted: Orders

## 2016-02-27 LAB — BILE ACIDS, TOTAL: BILE ACIDS TOTAL: 11 umol/L (ref 0–19)

## 2016-03-01 ENCOUNTER — Encounter: Payer: Self-pay | Admitting: Family Medicine

## 2016-03-01 ENCOUNTER — Telehealth: Payer: Self-pay | Admitting: Family Medicine

## 2016-03-01 DIAGNOSIS — O26613 Liver and biliary tract disorders in pregnancy, third trimester: Secondary | ICD-10-CM

## 2016-03-01 DIAGNOSIS — K831 Obstruction of bile duct: Secondary | ICD-10-CM | POA: Insufficient documentation

## 2016-03-01 NOTE — Telephone Encounter (Signed)
Patient's bile acids came back at 11. Cut off for diagnosis is 10. Patient officially diagnosed with cholestasis of pregnancy. Discussed patient with Dr. Omer JackMumaw and Wynelle BourgeoisMarie Williams, CNM.   Will need to do the following:  1. Start Ursediol 500 mg BID 2. Order OB limited U/S and BPP 3. Refer to high risk ob clinic   [facility] to discuss the above, no answer, left voicemail to call back to discuss labs.  Patient has an appointment tomorrow in clinic with Dr. Caroleen Hammanumley. Will forward this message to Dr. Caroleen Hammanumley so we can order the Ursediol, U/S and BPP after discussing diagnosis with patient. Will go ahead and place the referral to high risk ob clinic now.   Anders Simmondshristina Gambino, MD [facility], PGY-2

## 2016-03-02 ENCOUNTER — Ambulatory Visit (INDEPENDENT_AMBULATORY_CARE_PROVIDER_SITE_OTHER): Payer: Self-pay | Admitting: Family Medicine

## 2016-03-02 VITALS — BP 108/64 | HR 79 | Temp 98.0°F | Wt 174.2 lb

## 2016-03-02 DIAGNOSIS — K831 Obstruction of bile duct: Secondary | ICD-10-CM

## 2016-03-02 LAB — COMPLETE METABOLIC PANEL WITH GFR
ALT: 12 U/L (ref 6–29)
AST: 16 U/L (ref 10–30)
Albumin: 3.5 g/dL — ABNORMAL LOW (ref 3.6–5.1)
Alkaline Phosphatase: 81 U/L (ref 33–115)
BUN: 8 mg/dL (ref 7–25)
CALCIUM: 9.6 mg/dL (ref 8.6–10.2)
CHLORIDE: 103 mmol/L (ref 98–110)
CO2: 23 mmol/L (ref 20–31)
Creat: 0.42 mg/dL — ABNORMAL LOW (ref 0.50–1.10)
GFR, Est Non African American: >89 mL/min (ref >=60)
Glucose, Bld: 85 mg/dL (ref 65–99)
POTASSIUM: 4 mmol/L (ref 3.5–5.3)
Sodium: 137 mmol/L (ref 135–146)
Total Bilirubin: 0.3 mg/dL (ref 0.2–1.2)
Total Protein: 6.7 g/dL (ref 6.1–8.1)

## 2016-03-02 LAB — CBC
HCT: 33.4 % — ABNORMAL LOW (ref 35.0–45.0)
HEMOGLOBIN: 11.3 g/dL — AB (ref 11.7–15.5)
MCH: 28.9 pg (ref 27.0–33.0)
MCHC: 33.8 g/dL (ref 32.0–36.0)
MCV: 85.4 fL (ref 80.0–100.0)
MPV: 9.9 fL (ref 7.5–12.5)
PLATELETS: 245 10*3/uL (ref 140–400)
RBC: 3.91 MIL/uL (ref 3.80–5.10)
RDW: 14 % (ref 11.0–15.0)
WBC: 9.1 10*3/uL (ref 3.8–10.8)

## 2016-03-02 MED ORDER — URSODIOL 500 MG PO TABS: 500.0000 mg | ORAL_TABLET | Freq: Two times a day (BID) | ORAL | 0 refills | Status: DC

## 2016-03-02 NOTE — Patient Instructions (Signed)
Colestasis del embarazo (Cholestasis of Pregnancy) El trmino colestasis hace referencia a cualquier afeccin que provoca el enlentecimiento o la interrupcin del flujo del lquido de la digestin (bilis) que el [place]. La colestasis del embarazo es ms frecuente hacia el final del Psychiatrist (tercertrimestre), pero puede presentarse en cualquier momento durante la gestacin. Con frecuencia, la afeccin desaparece tan pronto como nace el beb. La colestasis puede causar molestias, pero a menudo no tiene efectos nocivos para usted; sin embargo, puede ser daina para el beb. La colestasis puede aumentar el riesgo de que el beb nazca demasiado pronto (parto prematuro). CAUSAS La causa de la colestasis del embarazo no se conoce. Las hormonas del embarazo pueden afectar el funcionamiento de la vescula biliar. Normalmente, la vescula biliar contiene la bilis que el hgado fabrica hasta que se la necesita para ayudar a Location manager las grasas de la dieta. Las hormonas del Biomedical scientist el flujo de la bilis y hacer que retroceda al hgado. Luego, la bilis ingresa al torrente sanguneo y provoca sntomas de colestasis. FACTORES DE RIESGO Puede correr ms riesgo si:  Tuvo colestasis durante un embarazo anterior.  Tiene antecedentes familiares de colestasis.  Tiene problemas de hgado.  Espera gemelos. SIGNOS Y SNTOMAS El sntoma ms frecuente de la colestasis del embarazo es la picazn intensa, especialmente de las palmas de las manos y las plantas de los pies. La picazn puede extenderse al resto del cuerpo y suele ser peor por la noche. Generalmente, no tendr una erupcin cutnea. Otros sntomas son:  Cansancio.  Coloracin amarillenta de la piel y la parte blanca de los ojos (ictericia).  Orina de color oscuro.  Heces de color claro.  Prdida del apetito. DIAGNSTICO El mdico revisar sus antecedentes mdicos y le har un examen fsico. Tal vez le hagan anlisis de sangre  para controlar la funcin heptica, el nivel de bilis y de bilirrubina. TRATAMIENTO El objetivo del tratamiento es hacer que se sienta ms cmoda y proteger al beb. El mdico puede recetar medicamentos para Production designer, theatre/television/film. El medicamento que se [place] tambin puede [employer] de los anlisis de sangre y Contractor a Conservator, museum/gallery al beb. Adems, el mdico puede darle vitaminaK antes del parto para evitar el sangrado excesivo. Es posible que el mdico quiera controlar frecuentemente al beb (monitoreo fetal), por ejemplo, cada 2semanas. Una vez que los pulmones del beb estn suficientemente desarrollados, el mdico puede recomendar el inicio (induccin) del [place] y el parto en la semana37de embarazo. INSTRUCCIONES PARA EL CUIDADO EN EL HOGAR  Use las cremas para Associate Professor y tome los medicamentos solamente como se lo haya indicado el mdico.  Tome baos de inmersin en agua fra para Associate Professor.  Mantenga las uas cortas para no irritar la piel al rascarse.  Concurra a todas las visitas de monitoreo fetal. SOLICITE ATENCIN MDICA SI: Los sntomas empeoran a pesar del TEFL teacher. SOLICITE ATENCIN MDICA DE INMEDIATO SI: Comienza el trabajo de parto prematuro en su casa. ASEGRESE DE QUE:  Comprende estas instrucciones.  Controlar su afeccin.  Recibir ayuda de inmediato si no mejora o si empeora. Esta informacin no tiene Theme park manager el consejo del mdico. Asegrese de hacerle al mdico cualquier pregunta que tenga. Document Released: 10/28/2004 Document Revised: 02/08/2014 Document Reviewed: 11/10/2012 Elsevier Interactive Patient Education  2017 Elsevier Inc.  Evaluacin de los movimientos fetales  (Fetal Movement Counts) Nombre del paciente: __________________________________________________ Beth Brown estimada: ____________________ Beth Brown de los movimientos fetales es  muy recomendable en los [address] [place] de alto riesgo, pero  tambin es una buena idea que lo hagan todas las Capitolaembarazadas. El Firefightermdico le indicar que comience a contarlos a las 28 semanas de [place]em[place]. Los movimientos fetales suelen aumentar:   Despus de Animatoruna comida completa.  Despus de la actividad fsica.  Despus de comer o beber Graybar Electricalgo dulce o fro.  En reposo. Preste atencin cuando sienta que el beb est ms activo. Esto le ayudar a notar un patrn de ciclos de vigilia y sueo de su beb y cules son los factores que contribuyen a un aumento de los movimientos fetales. Es importante llevar a cabo un recuento de movimientos fetales, al mismo tiempo cada da, cuando el beb normalmente est ms activo.  CMO CONTAR LOS MOVIMIENTOS FETALES 1. Busque un lugar tranquilo y cmodo para sentarse o recostarse sobre el lado izquierdo. Al recostarse sobre su lado izquierdo, le proporciona una mejor circulacin de [place] y oxgeno al beb. 2. Anote el da y la hora en una hoja de papel o en un diario. 3. Comience contando las pataditas, revoloteos, chasquidos, vueltas o pinchazos en un perodo de 2 horas. Debe sentir al menos 10 movimientos en 2 horas. 4. Si no siente 10 movimientos en 2 horas, espere 2  3 horas y cuente de nuevo. Busque cambios en el patrn o si no cuenta lo suficiente en 2 horas. SOLICITE ATENCIN MDICA SI:   Siente menos de 10 pataditas en 2 horas, en dos intentos.  No hay movimientos durante una hora.  El patrn se modifica o le lleva ms tiempo Art gallery managercada da contar las 10 pataditas.  Siente que el beb no se mueve como lo hace habitualmente. Fecha: ____________ Movimientos: ____________ Beth BornHora de inicio: ____________ Beth BornHora de finalizacin: ____________  Beth NonesFecha: ____________ Movimientos: ____________ Beth BornHora de inicio: ____________ Beth BornHora de finalizacin: ____________  Beth NonesFecha: ____________ Movimientos: ____________ Beth BornHora de inicio: ____________ Beth BornHora de finalizacin: ____________  Beth NonesFecha: ____________ Movimientos: ____________ Beth BornHora de inicio:  ____________ Beth BornHora de finalizacin: ____________  Beth NonesFecha: ____________ Movimientos: ____________ Beth BornHora de inicio: ____________ Beth RussianHora de finalizacin: ____________  Beth NonesFecha: ____________ Movimientos: ____________ Beth RussianHora de inicio: ____________ Beth RussianHora de finalizacin: ____________  Beth NonesFecha: ____________ Movimientos: ____________ Beth RussianHora de inicio: ____________ Beth RussianHora de finalizacin: ____________  Beth NonesFecha: ____________ Movimientos: ____________ Beth RussianHora de inicio: ____________ Beth RussianHora de finalizacin: ____________  Beth NonesFecha: ____________ Movimientos: ____________ Beth RussianHora de inicio: ____________ Beth RussianHora de finalizacin: ____________  Beth NonesFecha: ____________ Movimientos: ____________ Beth RussianHora de inicio: ____________ Beth RussianHora de finalizacin: ____________  Beth NonesFecha: ____________ Movimientos: ____________ Beth RussianHora de inicio: ____________ Beth RussianHora de finalizacin: ____________  Beth NonesFecha: ____________ Movimientos: ____________ Beth RussianHora de inicio: ____________ Beth RussianHora de finalizacin: ____________  Beth NonesFecha: ____________ Movimientos: ____________ Beth RussianHora de inicio: ____________ Beth RussianHora de finalizacin: ____________  Beth NonesFecha: ____________ Movimientos: ____________ Beth RussianHora de inicio: ____________ Beth RussianHora de finalizacin: ____________  Beth NonesFecha: ____________ Movimientos: ____________ Beth RussianHora de inicio: ____________ Beth RussianHora de finalizacin: ____________  Beth NonesFecha: ____________ Movimientos: ____________ Beth RussianHora de inicio: ____________ Beth RussianHora de finalizacin: ____________  Beth NonesFecha: ____________ Movimientos: ____________ Beth RussianHora de inicio: ____________ Beth RussianHora de finalizacin: ____________  Beth NonesFecha: ____________ Movimientos: ____________ Beth RussianHora de inicio: ____________ Beth RussianHora de finalizacin: ____________  Beth NonesFecha: ____________ Movimientos: ____________ Beth RussianHora de inicio: ____________ Beth RussianHora de finalizacin: ____________  Beth NonesFecha: ____________ Movimientos: ____________ Beth RussianHora de inicio: ____________ Beth RussianHora de finalizacin: ____________  Beth NonesFecha: ____________ Movimientos: ____________ Beth RussianHora de inicio: ____________ Beth RussianHora de finalizacin:  ____________  Beth NonesFecha: ____________ Movimientos: ____________ Beth RussianHora de inicio: ____________ Beth BornHora de finalizacin: ____________  Beth NonesFecha: ____________ Movimientos: ____________ Beth BornHora de inicio: ____________ Beth BornHora de finalizacin: ____________  Beth NonesFecha: ____________ Movimientos: ____________ Beth RussianHora de inicio: ____________  Hora de finalizacin: ____________  Beth Brown: ____________ Movimientos: ____________ Beth Brown inicio: ____________ Beth Brown finalizacin: ____________  Beth Brown: ____________ Movimientos: ____________ Beth Brown inicio: ____________ Beth Brown finalizacin: ____________  Beth Brown: ____________ Movimientos: ____________ Beth Brown inicio: ____________ Beth Brown finalizacin: ____________  Beth Brown: ____________ Movimientos: ____________ Beth Brown inicio: ____________ Beth Brown finalizacin: ____________  Beth Brown: ____________ Movimientos: ____________ Beth Brown inicio: ____________ Beth Brown de finalizacin: ____________  Beth Brown: ____________ Movimientos: ____________ Beth Brown de inicio: ____________ Beth Brown de finalizacin: ____________  Beth Brown: ____________ Movimientos: ____________ Beth Brown de inicio: ____________ Beth Brown de finalizacin: ____________  Beth Brown: ____________ Movimientos: ____________ Beth Brown de inicio: ____________ Beth Brown de finalizacin: ____________  Beth Brown: ____________ Movimientos: ____________ Beth Brown de inicio: ____________ Beth Brown de finalizacin: ____________  Beth Brown: ____________ Movimientos: ____________ Beth Brown de inicio: ____________ Beth Brown de finalizacin: ____________  Beth Brown: ____________ Movimientos: ____________ Beth Brown de inicio: ____________ Beth Brown de finalizacin: ____________  Beth Brown: ____________ Movimientos: ____________ Beth Brown de inicio: ____________ Beth Brown de finalizacin: ____________  Beth Brown: ____________ Movimientos: ____________ Beth Brown de inicio: ____________ Beth Brown de finalizacin: ____________  Beth Brown: ____________ Movimientos: ____________ Beth Brown de inicio: ____________ Beth Brown de finalizacin: ____________  Beth Brown: ____________  Movimientos: ____________ Beth Brown de inicio: ____________ Beth Brown de finalizacin: ____________  Beth Brown: ____________ Movimientos: ____________ Beth Brown de inicio: ____________ Beth Brown de finalizacin: ____________  Beth Brown: ____________ Movimientos: ____________ Beth Brown de inicio: ____________ Beth Brown de finalizacin: ____________  Beth Brown: ____________ Movimientos: ____________ Beth Brown de inicio: ____________ Beth Brown de finalizacin: ____________  Beth Brown: ____________ Movimientos: ____________ Beth Brown de inicio: ____________ Beth Brown de finalizacin: ____________  Beth Brown: ____________ Movimientos: ____________ Beth Brown de inicio: ____________ Beth Brown de finalizacin: ____________  Beth Brown: ____________ Movimientos: ____________ Beth Brown de inicio: ____________ Beth Brown de finalizacin: ____________  Beth Brown: ____________ Movimientos: ____________ Beth Brown de inicio: ____________ Beth Brown de finalizacin: ____________  Beth Brown: ____________ Movimientos: ____________ Beth Brown de inicio: ____________ Beth Brown de finalizacin: ____________  Beth Brown: ____________ Movimientos: ____________ Beth Brown de inicio: ____________ Beth Brown de finalizacin: ____________  Beth Brown: ____________ Movimientos: ____________ Beth Brown de inicio: ____________ Beth Brown de finalizacin: ____________  Beth Brown: ____________ Movimientos: ____________ Beth Brown de inicio: ____________ Beth Brown de finalizacin: ____________  Beth Brown: ____________ Movimientos: ____________ Beth Brown de inicio: ____________ Beth Brown de finalizacin: ____________  Beth Brown: ____________ Movimientos: ____________ Beth Brown de inicio: ____________ Beth Brown de finalizacin: ____________  Beth Brown: ____________ Movimientos: ____________ Beth Brown de inicio: ____________ Beth Brown de finalizacin: ____________  Beth Brown: ____________ Movimientos: ____________ Beth Brown de inicio: ____________ Beth Brown de finalizacin: ____________  Beth Brown: ____________ Movimientos: ____________ Beth Brown de inicio: ____________ Beth Brown de finalizacin: ____________  Beth Brown: ____________ Movimientos: ____________ Beth Brown de inicio:  ____________ Beth Brown de finalizacin: ____________  Esta informacin no tiene como fin reemplazar el consejo del mdico. Asegrese de hacerle al mdico cualquier pregunta que tenga. Document Released: 04/27/2007 Document Revised: 01/05/2012 Elsevier Interactive Patient Education  2017 [employer].

## 2016-03-02 NOTE — Progress Notes (Signed)
Beth Brown is a 36 y.o. [phone] at 5941w0d for routine follow up.  She reports some improvement in itching. Denies vaginal bleeding or discharge, denies loss of fluid. Denies headache, blurred vision, edema. Continues to note fetal movement.  See flow sheet for details.  A/P: Pregnancy at 2141w0d.  Doing well.   Pregnancy issues include Cholestasis of Pregnancy. CMP obtained today. BPP/NST and US ordered. Ursodiol initiated. Referral to [facility] made previously.  CBC, RPR, and HIV were done today.   RH status was reviewed and pt does not need Rhogam.  Rhogam was not given today.   Preterm labor precautions reviewed. Kick counts reviewed. Follow up with [facility].

## 2016-03-03 ENCOUNTER — Encounter: Payer: Self-pay | Admitting: Obstetrics & Gynecology

## 2016-03-03 ENCOUNTER — Ambulatory Visit (HOSPITAL_COMMUNITY)
Admission: RE | Admit: 2016-03-03 | Discharge: 2016-03-03 | Disposition: A | Discharge status: Home / Self Care | Payer: Self-pay | Source: Ambulatory Visit | Attending: Family Medicine | Admitting: Family Medicine

## 2016-03-03 ENCOUNTER — Encounter (HOSPITAL_COMMUNITY): Payer: Self-pay

## 2016-03-03 ENCOUNTER — Telehealth: Payer: Self-pay | Admitting: *Deleted

## 2016-03-03 DIAGNOSIS — Z3A29 29 weeks gestation of pregnancy: Secondary | ICD-10-CM | POA: Insufficient documentation

## 2016-03-03 DIAGNOSIS — O99213 Obesity complicating pregnancy, third trimester: Secondary | ICD-10-CM | POA: Insufficient documentation

## 2016-03-03 DIAGNOSIS — K831 Obstruction of bile duct: Secondary | ICD-10-CM | POA: Insufficient documentation

## 2016-03-03 DIAGNOSIS — O26613 Liver and biliary tract disorders in pregnancy, third trimester: Secondary | ICD-10-CM | POA: Insufficient documentation

## 2016-03-03 LAB — RPR

## 2016-03-03 LAB — HIV ANTIBODY (ROUTINE TESTING W REFLEX): HIV: NONREACTIVE

## 2016-03-03 NOTE — Telephone Encounter (Signed)
Patient called stating the medication Ursodiol was sent to pharmacy yesterday.  However, the medication is to expensive over $400.  Patient does not have any insurance.  Please send in something different.  Clovis PuMartin, Klaudia Beirne L, RN

## 2016-03-04 ENCOUNTER — Other Ambulatory Visit: Payer: Self-pay | Admitting: Family Medicine

## 2016-03-04 DIAGNOSIS — K831 Obstruction of bile duct: Secondary | ICD-10-CM

## 2016-03-05 ENCOUNTER — Telehealth: Payer: Self-pay | Admitting: Family Medicine

## 2016-03-05 MED ORDER — URSODIOL 500 MG PO TABS: 500.0000 mg | ORAL_TABLET | Freq: Two times a day (BID) | ORAL | 0 refills | Status: DC

## 2016-03-05 NOTE — Telephone Encounter (Signed)
Patient came in to clinic this afternoon and [facility]. States she will use coupon.

## 2016-03-05 NOTE — Telephone Encounter (Signed)
Pt called again about the medcine being too expensive. Please advise

## 2016-03-05 NOTE — Telephone Encounter (Signed)
Coupon for [employer] Printed, bringing price to ~[facility] Will print prescription and leave both prescription and Coupon for [facility] at front desk.  Discussed with Ob/Gyn and there are no other more affordable alternatives. Discussed with Pharmacy and they know of no other more affordable places to obtain Ursodiol. Please call and let Beth Brown know, will need Spanish interpretor.

## 2016-03-10 ENCOUNTER — Encounter (HOSPITAL_COMMUNITY): Payer: Self-pay

## 2016-03-10 ENCOUNTER — Ambulatory Visit (HOSPITAL_COMMUNITY)
Admission: RE | Admit: 2016-03-10 | Discharge: 2016-03-10 | Disposition: A | Discharge status: Home / Self Care | Payer: Self-pay | Source: Ambulatory Visit | Attending: Obstetrics and Gynecology | Admitting: Obstetrics and Gynecology

## 2016-03-10 ENCOUNTER — Other Ambulatory Visit: Payer: Self-pay | Admitting: Obstetrics and Gynecology

## 2016-03-10 ENCOUNTER — Ambulatory Visit (INDEPENDENT_AMBULATORY_CARE_PROVIDER_SITE_OTHER): Payer: Self-pay | Admitting: Obstetrics and Gynecology

## 2016-03-10 VITALS — BP 116/63 | HR 78 | Wt 170.5 lb

## 2016-03-10 DIAGNOSIS — Z3480 Encounter for supervision of other normal pregnancy, unspecified trimester: Secondary | ICD-10-CM

## 2016-03-10 DIAGNOSIS — O99213 Obesity complicating pregnancy, third trimester: Secondary | ICD-10-CM | POA: Insufficient documentation

## 2016-03-10 DIAGNOSIS — O26613 Liver and biliary tract disorders in pregnancy, third trimester: Secondary | ICD-10-CM | POA: Insufficient documentation

## 2016-03-10 DIAGNOSIS — K831 Obstruction of bile duct: Secondary | ICD-10-CM

## 2016-03-10 DIAGNOSIS — Z3A30 30 weeks gestation of pregnancy: Secondary | ICD-10-CM

## 2016-03-10 DIAGNOSIS — Z3689 Encounter for other specified antenatal screening: Secondary | ICD-10-CM | POA: Insufficient documentation

## 2016-03-10 DIAGNOSIS — R8271 Bacteriuria: Secondary | ICD-10-CM

## 2016-03-10 DIAGNOSIS — O26643 Intrahepatic cholestasis of pregnancy, third trimester: Secondary | ICD-10-CM

## 2016-03-10 NOTE — Progress Notes (Signed)
Spanish Interpreter Murlean Callerna Pevida  BPP scheduled for  03/10/16 @ 1530 and scheduled for 03/17/16 @ 0745.

## 2016-03-10 NOTE — Progress Notes (Signed)
   PRENATAL VISIT NOTE  Subjective:  Beth Brown is a 36 y.o. [phone] at 4655w1d being seen today for ongoing prenatal care. Patient transferred care from family practise due to cholestasis of pregnancy. She is currently monitored for the following issues for this high-risk pregnancy and has DYSTHYMIC DISORDER; HEMORRHOIDS, NOS; HEARTBURN; Family planning; Acne; Keratitis; Supervision of normal pregnancy, antepartum; Cholestasis during pregnancy in third trimester; and GBS bacteriuria on her problem list.  Patient reports no complaints.   . Vag. Bleeding: None.  Movement: Present. Denies leaking of fluid.   The following portions of the patient's history were reviewed and updated as appropriate: allergies, current medications, past family history, past medical history, past social history, past surgical history and problem list. Problem list updated.  Objective:   Vitals:   03/10/16 1253  BP: 116/63  Pulse: 78  Weight: 170 lb 8 oz (77.3 kg)    Fetal Status: Fetal Heart Rate (bpm): 144 Fundal Height: 30 cm Movement: Present     General:  Alert, oriented and cooperative. Patient is in no acute distress.  Skin: Skin is warm and dry. No rash noted.   Cardiovascular: Normal heart rate noted  Respiratory: Normal respiratory effort, no problems with respiration noted  Abdomen: Soft, gravid, appropriate for gestational age. Pain/Pressure: Present     Pelvic:  Cervical exam deferred        Extremities: Normal range of motion.  Edema: None  Mental Status: Normal mood and affect. Normal behavior. Normal judgment and thought content.   Assessment and Plan:  Pregnancy: G4P3003 at 8055w1d  1. Cholestasis during pregnancy in third trimester Answered questions regarding meaning of diagnosis and implication on fetus Discussed plan for IOL at 37 weeks Continue Actigall Weekly BPP ordered Will start twice weekly NST at 32 weeks - US MFM FETAL BPP WO NON STRESS; Future  2. Supervision  of other normal pregnancy, antepartum Patient is doing well - US MFM FETAL BPP WO NON STRESS; Future  3. GBS bacteriuria Will need to re culture for sensitivities due to PCN allergy Will provide prophylaxis in labor   Preterm labor symptoms and general obstetric precautions including but not limited to vaginal bleeding, contractions, leaking of fluid and fetal movement were reviewed in detail with the patient. Please refer to After Visit Summary for other counseling recommendations.  Return in about 2 weeks (around 03/24/2016).   Catalina AntiguaPeggy Hubert Raatz, MD

## 2016-03-11 ENCOUNTER — Other Ambulatory Visit (HOSPITAL_COMMUNITY): Payer: Self-pay | Admitting: *Deleted

## 2016-03-11 DIAGNOSIS — O26613 Liver and biliary tract disorders in pregnancy, third trimester: Principal | ICD-10-CM

## 2016-03-11 DIAGNOSIS — K831 Obstruction of bile duct: Secondary | ICD-10-CM

## 2016-03-18 ENCOUNTER — Ambulatory Visit (HOSPITAL_COMMUNITY)
Admission: RE | Admit: 2016-03-18 | Discharge: 2016-03-18 | Disposition: A | Discharge status: Home / Self Care | Payer: Self-pay | Source: Ambulatory Visit | Attending: Obstetrics and Gynecology | Admitting: Obstetrics and Gynecology

## 2016-03-18 ENCOUNTER — Encounter (HOSPITAL_COMMUNITY): Payer: Self-pay

## 2016-03-18 DIAGNOSIS — O99213 Obesity complicating pregnancy, third trimester: Secondary | ICD-10-CM | POA: Insufficient documentation

## 2016-03-18 DIAGNOSIS — K831 Obstruction of bile duct: Secondary | ICD-10-CM | POA: Insufficient documentation

## 2016-03-18 DIAGNOSIS — O26613 Liver and biliary tract disorders in pregnancy, third trimester: Secondary | ICD-10-CM | POA: Insufficient documentation

## 2016-03-18 DIAGNOSIS — Z3A31 31 weeks gestation of pregnancy: Secondary | ICD-10-CM | POA: Insufficient documentation

## 2016-03-24 ENCOUNTER — Ambulatory Visit (INDEPENDENT_AMBULATORY_CARE_PROVIDER_SITE_OTHER): Payer: Self-pay | Admitting: Clinical

## 2016-03-24 ENCOUNTER — Encounter: Payer: Self-pay | Admitting: Advanced Practice Midwife

## 2016-03-24 ENCOUNTER — Ambulatory Visit (INDEPENDENT_AMBULATORY_CARE_PROVIDER_SITE_OTHER): Payer: Self-pay | Admitting: Obstetrics and Gynecology

## 2016-03-24 VITALS — BP 123/59 | HR 82 | Wt 174.0 lb

## 2016-03-24 DIAGNOSIS — K831 Obstruction of bile duct: Secondary | ICD-10-CM

## 2016-03-24 DIAGNOSIS — L2089 Other atopic dermatitis: Secondary | ICD-10-CM

## 2016-03-24 DIAGNOSIS — R8271 Bacteriuria: Secondary | ICD-10-CM

## 2016-03-24 DIAGNOSIS — Z23 Encounter for immunization: Secondary | ICD-10-CM

## 2016-03-24 DIAGNOSIS — O26613 Liver and biliary tract disorders in pregnancy, third trimester: Secondary | ICD-10-CM

## 2016-03-24 DIAGNOSIS — Z3480 Encounter for supervision of other normal pregnancy, unspecified trimester: Secondary | ICD-10-CM

## 2016-03-24 DIAGNOSIS — F4323 Adjustment disorder with mixed anxiety and depressed mood: Secondary | ICD-10-CM

## 2016-03-24 MED ORDER — TRIAMCINOLONE ACETONIDE 0.1 % EX OINT: 1.0000 "application " | TOPICAL_OINTMENT | Freq: Three times a day (TID) | CUTANEOUS | 0 refills | Status: DC

## 2016-03-24 NOTE — Progress Notes (Signed)
Patient reports continued hives- requests refill on kenalog. Patient also reports irregular contractions yesterday, but they eventually went away  Patient agrees to see Asher MuirJamie today due to elevated phq9 & gad7

## 2016-03-24 NOTE — BH Specialist Note (Addendum)
Session Start time: 9:10   End Time: 9:33 Total Time:  23 minutes Type of Service: Behavioral Health - Individual/Family Interpreter: Yes.     Interpreter Name & Language: Macario GoldsSpanish, Jaime # [facility]  SUBJECTIVE: Beth Brown is a 36 y.o. female  Pt. was referred by Dr Alysia PennaErvin for:  anxiety and depression. Pt. reports the following symptoms/concerns: Pt states that feelings of depression feel "different" than previous bout of depression over two years prior, with primary symptoms of irritability and tiredness; feels it would be helpful to get out of the house more. Duration of problem:  Over one month Severity: moderate Previous treatment: none  OBJECTIVE: Mood: Depressed & Affect: Appropriate Risk of harm to self or others: No known risk of harm to self or others Assessments administered: PHQ9: 14/ GAD7: 11  LIFE CONTEXT:  Family & Social: Lives with husband and three children School/ Work: Undetermined  Self-Care: Some sleeping and appetite issues, no particular coping with self-care  Life changes: Current pregnancy  What is important to pt/family (values): Feeling healthy, healthy baby  GOALS ADDRESSED:  -Reduce symptoms of anxiety and depression  INTERVENTIONS: Motivational Interviewing   ASSESSMENT:  Pt currently experiencing Adjustment disorder with mixed anxiety and depressed mood.  Pt may benefit from psychoeducation and brief therapeutic interventions regarding coping with symptoms of anxiety and depression .   PLAN: 1. F/U with behavioral health clinician: Two weeks 2. Behavioral Health meds: none 3. Behavioral recommendations:  -Daily outing after children leave for school (pick one of the following daily: visit a friend, outdoor walk, or shopping trip) -Read educational material regarding coping with symptoms of anxiety and depression -Use Postpartum Planner as guide to discuss with friends and family ways they can plan to help  postpartum 4. Referral: Brief Counseling/Psychotherapy and Psychoeducation 5. From scale of 1-10, how likely are you to follow plan: 8  Woc-Behavioral Health Clinician  Behavioral Health Clinician  Marlon PelWarmhandoff:   Warm Hand Off Completed.        Depression screen [facility] 2/9 03/24/2016 03/10/2016 03/02/2016 01/20/2016 10/28/2015  Decreased Interest 2 1 0 0 3  Down, Depressed, Hopeless 0 0 0 0 1  PHQ - 2 Score 2 1 0 0 4  Altered sleeping 2 1 - - 2  Tired, decreased energy 3 1 - - 2  Change in appetite 2 0 - - 2  Feeling bad or failure about yourself  2 2 - - 1  Trouble concentrating 1 1 - - 1  Moving slowly or fidgety/restless 2 1 - - 1  Suicidal thoughts 0 0 - - 0  PHQ-9 Score 14 7 - - 13   GAD 7 : Generalized Anxiety Score 03/24/2016 03/10/2016  Nervous, Anxious, on Edge 1 2  Control/stop worrying 1 2  Worry too much - different things 1 1  Trouble relaxing 1 1  Restless 2 2  Easily annoyed or irritable 3 2  Afraid - awful might happen 2 1  Total GAD 7 Score 11 11

## 2016-03-24 NOTE — Progress Notes (Signed)
Subjective:  Beth MoleMaria DEJESUS Brown is a 36 y.o. [phone] at 544w1d being seen today for ongoing prenatal care.  She is currently monitored for the following issues for this high-risk pregnancy and has DYSTHYMIC DISORDER; HEMORRHOIDS, NOS; HEARTBURN; Family planning; Acne; Keratitis; Supervision of normal pregnancy, antepartum; Cholestasis during pregnancy in third trimester; and GBS bacteriuria on her problem list.  Patient reports no complaints.  Contractions: Irregular. Vag. Bleeding: None.  Movement: Present. Denies leaking of fluid.   The following portions of the patient's history were reviewed and updated as appropriate: allergies, current medications, past family history, past medical history, past social history, past surgical history and problem list. Problem list updated.  Objective:   Vitals:   03/24/16 0832  BP: (!) 123/59  Pulse: 82  Weight: 174 lb (78.9 kg)    Fetal Status: Fetal Heart Rate (bpm): nst   Movement: Present     General:  Alert, oriented and cooperative. Patient is in no acute distress.  Skin: Skin is warm and dry. No rash noted.   Cardiovascular: Normal heart rate noted  Respiratory: Normal respiratory effort, no problems with respiration noted  Abdomen: Soft, gravid, appropriate for gestational age. Pain/Pressure: Present     Pelvic:  Cervical exam deferred        Extremities: Normal range of motion.  Edema: None  Mental Status: Normal mood and affect. Normal behavior. Normal judgment and thought content.   Urinalysis:      Assessment and Plan:  Pregnancy: G4P3003 at 6144w1d  1. Cholestasis during pregnancy in third trimester Reactive NST today Continue with twice weekly testing - Fetal nonstress test - Tdap vaccine greater than or equal to 7yo IM - Ambulatory referral to Integrated Behavioral Health - triamcinolone ointment (KENALOG) 0.1 %; Apply 1 application topically 3 (three) times daily. To affected area on skin for 2-4 weeks.  Dispense: 80 g;  Refill: 0  2. Supervision of other normal pregnancy, antepartum  - Fetal nonstress test - Tdap vaccine greater than or equal to 7yo IM - Ambulatory referral to Integrated Behavioral Health  3. GBS bacteriuria Will need retesting for sensitivities d/t PCN allergy  4. Other atopic dermatitis  Preterm labor symptoms and general obstetric precautions including but not limited to vaginal bleeding, contractions, leaking of fluid and fetal movement were reviewed in detail with the patient. Please refer to After Visit Summary for other counseling recommendations.  Return in about 1 week (around 03/31/2016) for OB visit.   Hermina StaggersMichael L Lestine Rahe, MD

## 2016-03-24 NOTE — Patient Instructions (Signed)
Tercer trimestre de embarazo (Third Trimester of Pregnancy) El tercer trimestre comprende desde la semana29 hasta la semana42, es decir, desde el mes7 hasta el mes9. El tercer trimestre es un perodo en el que el feto crece rpidamente. Hacia el final del noveno mes, el feto mide alrededor de 20pulgadas (45cm) de largo y pesa entre 6 y 10 libras (2,700 y 4,500kg). CAMBIOS EN EL ORGANISMO Su organismo atraviesa por muchos cambios durante el embarazo, y estos varan de una mujer a otra.  Seguir aumentando de peso. Es de esperar que aumente entre 25 y 35libras (11 y 16kg) hacia el final del embarazo.  Podrn aparecer las primeras estras en las caderas, el abdomen y las mamas.  Puede tener necesidad de orinar con ms frecuencia porque el feto baja hacia la pelvis y ejerce presin sobre la vejiga.  Debido al embarazo podr sentir acidez estomacal con frecuencia.  Puede estar estreida, ya que ciertas hormonas enlentecen los movimientos de los msculos que empujan los desechos a travs de los intestinos.  Pueden aparecer hemorroides o abultarse e hincharse las venas (venas varicosas).  Puede sentir dolor plvico debido al aumento de peso y a que las hormonas del embarazo relajan las articulaciones entre los huesos de la pelvis. El dolor de espalda puede ser consecuencia de la sobrecarga de los msculos que soportan la postura.  Tal vez haya cambios en el cabello que pueden incluir su engrosamiento, crecimiento rpido y cambios en la textura. Adems, a algunas mujeres se les cae el cabello durante o despus del embarazo, o tienen el cabello seco o fino. Lo ms probable es que el cabello se le normalice despus del nacimiento del beb.  Las mamas seguirn creciendo y le dolern. A veces, puede haber una secrecin amarilla de las mamas llamada calostro.  El ombligo puede salir hacia afuera.  Puede sentir que le falta el aire debido a que se expande el tero.  Puede notar que el feto  "baja" o lo siente ms bajo, en el abdomen.  Puede tener una prdida de secrecin mucosa con sangre. Esto suele ocurrir en el trmino de unos pocos das a una semana antes de que comience el trabajo de parto.  El cuello del tero se vuelve delgado y blando (se borra) cerca de la fecha de parto. QU DEBE ESPERAR EN LOS EXMENES PRENATALES Le harn exmenes prenatales cada 2semanas hasta la semana36. A partir de ese momento le harn exmenes semanales. Durante una visita prenatal de rutina:  La pesarn para asegurarse de que usted y el feto estn creciendo normalmente.  Le tomarn la presin arterial.  Le medirn el abdomen para controlar el desarrollo del beb.  Se escucharn los latidos cardacos fetales.  Se evaluarn los resultados de los estudios solicitados en visitas anteriores.  Le revisarn el cuello del tero cuando est prxima la fecha de parto para controlar si este se ha borrado. Alrededor de la semana36, el mdico le revisar el cuello del tero. Al mismo tiempo, realizar un anlisis de las secreciones del tejido vaginal. Este examen es para determinar si hay un tipo de bacteria, estreptococo Grupo B. El mdico le explicar esto con ms detalle. El mdico puede preguntarle lo siguiente:  Cmo le gustara que fuera el parto.  Cmo se siente.  Si siente los movimientos del beb.  Si ha tenido sntomas anormales, como prdida de lquido, sangrado, dolores de cabeza intensos o clicos abdominales.  Si est consumiendo algn producto que contenga tabaco, como cigarrillos, tabaco de mascar y   cigarrillos electrnicos.  Si tiene alguna pregunta. Otros exmenes o estudios de deteccin que pueden realizarse durante el tercer trimestre incluyen lo siguiente:  Anlisis de sangre para controlar los niveles de hierro (anemia).  Controles fetales para determinar su salud, nivel de actividad y crecimiento. Si tiene alguna enfermedad o hay problemas durante el embarazo, le harn  estudios.  Prueba del VIH (virus de inmunodeficiencia humana). Si corre un riesgo alto, pueden realizarle una prueba de deteccin del VIH durante el tercer trimestre del embarazo. FALSO TRABAJO DE PARTO Es posible que sienta contracciones leves e irregulares que finalmente desaparecen. Se llaman contracciones de Braxton Hicks o falso trabajo de parto. Las contracciones pueden durar horas, das o incluso semanas, antes de que el verdadero trabajo de parto se inicie. Si las contracciones ocurren a intervalos regulares, se intensifican o se hacen dolorosas, lo mejor es que la revise el mdico. SIGNOS DE TRABAJO DE PARTO  Clicos de tipo menstrual.  Contracciones cada 5minutos o menos.  Contracciones que comienzan en la parte superior del tero y se extienden hacia abajo, a la zona inferior del abdomen y la espalda.  Sensacin de mayor presin en la pelvis o dolor de espalda.  Una secrecin de mucosidad acuosa o con sangre que sale de la vagina. Si tiene alguno de estos signos antes de la semana37 del embarazo, llame a su mdico de inmediato. Debe concurrir al hospital para que la controlen inmediatamente. INSTRUCCIONES PARA EL CUIDADO EN EL HOGAR  Evite fumar, consumir hierbas, beber alcohol y tomar frmacos que no le hayan recetado. Estas sustancias qumicas afectan la formacin y el desarrollo del beb.  No consuma ningn producto que contenga tabaco, lo que incluye cigarrillos, tabaco de mascar y cigarrillos electrnicos. Si necesita ayuda para dejar de fumar, consulte al mdico. Puede recibir asesoramiento y otro tipo de recursos para dejar de fumar.  Siga las indicaciones del mdico en relacin con el uso de medicamentos. Durante el embarazo, hay medicamentos que son seguros de tomar y otros que no.  Haga ejercicio solamente como se lo haya indicado el mdico. Sentir clicos uterinos es un buen signo para detener la actividad fsica.  Contine comiendo alimentos sanos con  regularidad.  Use un sostn que le brinde buen soporte si le duelen las mamas.  No se d baos de inmersin en agua caliente, baos turcos ni saunas.  Use el cinturn de seguridad en todo momento mientras conduce.  No coma carne cruda ni queso sin cocinar; evite el contacto con las bandejas sanitarias de los gatos y la tierra que estos animales usan. Estos elementos contienen grmenes que pueden causar defectos congnitos en el beb.  Tome las vitaminas prenatales.  Tome entre 1500 y 2000mg de calcio diariamente comenzando en la semana20 del embarazo hasta el parto.  Si est estreida, pruebe un laxante suave (si el mdico lo autoriza). Consuma ms alimentos ricos en fibra, como vegetales y frutas frescos y cereales integrales. Beba gran cantidad de lquido para mantener la orina de tono claro o color amarillo plido.  Dese baos de asiento con agua tibia para aliviar el dolor o las molestias causadas por las hemorroides. Use una crema para las hemorroides si el mdico la autoriza.  Si tiene venas varicosas, use medias de descanso. Eleve los pies durante 15minutos, 3 o 4veces por da. Limite el consumo de sal en su dieta.  Evite levantar objetos pesados, use zapatos de tacones bajos y mantenga una buena postura.  Descanse con las piernas elevadas si tiene   calambres o dolor de cintura.  Visite a su dentista si no lo ha hecho durante el embarazo. Use un cepillo de dientes blando para higienizarse los dientes y psese el hilo dental con suavidad.  Puede seguir manteniendo relaciones sexuales, a menos que el mdico le indique lo contrario.  No haga viajes largos excepto que sea absolutamente necesario y solo con la autorizacin del mdico.  Tome clases prenatales para entender, practicar y hacer preguntas sobre el trabajo de parto y el parto.  Haga un ensayo de la partida al hospital.  Prepare el bolso que llevar al hospital.  Prepare la habitacin del beb.  Concurra a todas  las visitas prenatales segn las indicaciones de su mdico.  SOLICITE ATENCIN MDICA SI:  No est segura de que est en trabajo de parto o de que ha roto la bolsa de las aguas.  Tiene mareos.  Siente clicos leves, presin en la pelvis o dolor persistente en el abdomen.  Tiene nuseas, vmitos o diarrea persistentes.  Observa una secrecin vaginal con mal olor.  Siente dolor al orinar.  SOLICITE ATENCIN MDICA DE INMEDIATO SI:  Tiene fiebre.  Tiene una prdida de lquido por la vagina.  Tiene sangrado o pequeas prdidas vaginales.  Siente dolor intenso o clicos en el abdomen.  Sube o baja de peso rpidamente.  Tiene dificultad para respirar y siente dolor de pecho.  Sbitamente se le hinchan mucho el rostro, las manos, los tobillos, los pies o las piernas.  No ha sentido los movimientos del beb durante una hora.  Siente un dolor de cabeza intenso que no se alivia con medicamentos.  Su visin se modifica.  Esta informacin no tiene como fin reemplazar el consejo del mdico. Asegrese de hacerle al mdico cualquier pregunta que tenga. Document Released: 10/28/2004 Document Revised: 02/08/2014 Document Reviewed: 03/21/2012 Elsevier Interactive Patient Education  2017 Elsevier Inc.  

## 2016-03-26 ENCOUNTER — Ambulatory Visit: Payer: Self-pay

## 2016-03-26 ENCOUNTER — Ambulatory Visit (INDEPENDENT_AMBULATORY_CARE_PROVIDER_SITE_OTHER): Payer: Self-pay | Admitting: Obstetrics & Gynecology

## 2016-03-26 DIAGNOSIS — Z3689 Encounter for other specified antenatal screening: Secondary | ICD-10-CM

## 2016-03-26 DIAGNOSIS — K831 Obstruction of bile duct: Secondary | ICD-10-CM

## 2016-03-26 DIAGNOSIS — O26613 Liver and biliary tract disorders in pregnancy, third trimester: Secondary | ICD-10-CM

## 2016-03-26 DIAGNOSIS — O26643 Intrahepatic cholestasis of pregnancy, third trimester: Secondary | ICD-10-CM

## 2016-03-26 NOTE — Progress Notes (Signed)
Pt informed that the ultrasound is considered a limited OB ultrasound and is not intended to be a complete ultrasound exam.  Patient also informed that the ultrasound is not being completed with the intent of assessing for fetal or placental anomalies or any pelvic abnormalities.  Explained that the purpose of today's ultrasound is to assess for presentation and amniotic fluid volume.  Patient acknowledges the purpose of the exam and the limitations of the study.    

## 2016-03-29 ENCOUNTER — Ambulatory Visit (INDEPENDENT_AMBULATORY_CARE_PROVIDER_SITE_OTHER): Payer: Self-pay | Admitting: Obstetrics and Gynecology

## 2016-03-29 VITALS — BP 119/53 | HR 77

## 2016-03-29 DIAGNOSIS — O26613 Liver and biliary tract disorders in pregnancy, third trimester: Secondary | ICD-10-CM

## 2016-03-29 DIAGNOSIS — K831 Obstruction of bile duct: Secondary | ICD-10-CM

## 2016-03-29 NOTE — Progress Notes (Signed)
NST Note Date: 03/29/2016 Gestational Age: 34/6 FHT: 140 baseline, positive accelerations, negative, deceleration, moderate variability Toco: occasional UCs Time: 20 minutes  A/P: rNST. Continue current plant of care.   Beth Brown, Jr MD Attending [facility](Faculty Practice)

## 2016-04-01 ENCOUNTER — Ambulatory Visit (INDEPENDENT_AMBULATORY_CARE_PROVIDER_SITE_OTHER): Payer: Self-pay | Admitting: Obstetrics & Gynecology

## 2016-04-01 ENCOUNTER — Ambulatory Visit: Payer: Self-pay

## 2016-04-01 VITALS — BP 120/61 | HR 82

## 2016-04-01 DIAGNOSIS — O26613 Liver and biliary tract disorders in pregnancy, third trimester: Secondary | ICD-10-CM

## 2016-04-01 DIAGNOSIS — K831 Obstruction of bile duct: Secondary | ICD-10-CM

## 2016-04-01 DIAGNOSIS — Z3689 Encounter for other specified antenatal screening: Secondary | ICD-10-CM

## 2016-04-01 NOTE — Progress Notes (Signed)
Pt informed that the ultrasound is considered a limited OB ultrasound and is not intended to be a complete ultrasound exam.  Patient also informed that the ultrasound is not being completed with the intent of assessing for fetal or placental anomalies or any pelvic abnormalities.  Explained that the purpose of today's ultrasound is to assess for presentation and amniotic fluid volume.  Patient acknowledges the purpose of the exam and the limitations of the study.    

## 2016-04-05 ENCOUNTER — Ambulatory Visit: Payer: Self-pay | Admitting: Clinical

## 2016-04-05 ENCOUNTER — Ambulatory Visit (INDEPENDENT_AMBULATORY_CARE_PROVIDER_SITE_OTHER): Payer: Self-pay | Admitting: Family Medicine

## 2016-04-05 ENCOUNTER — Encounter: Payer: Self-pay | Admitting: Obstetrics & Gynecology

## 2016-04-05 ENCOUNTER — Ambulatory Visit: Payer: Self-pay

## 2016-04-05 VITALS — BP 117/68 | HR 78 | Wt 178.2 lb

## 2016-04-05 DIAGNOSIS — F4323 Adjustment disorder with mixed anxiety and depressed mood: Secondary | ICD-10-CM

## 2016-04-05 DIAGNOSIS — O99719 Diseases of the skin and subcutaneous tissue complicating pregnancy, unspecified trimester: Principal | ICD-10-CM

## 2016-04-05 DIAGNOSIS — K831 Obstruction of bile duct: Secondary | ICD-10-CM

## 2016-04-05 DIAGNOSIS — O99713 Diseases of the skin and subcutaneous tissue complicating pregnancy, third trimester: Secondary | ICD-10-CM

## 2016-04-05 DIAGNOSIS — O26613 Liver and biliary tract disorders in pregnancy, third trimester: Secondary | ICD-10-CM

## 2016-04-05 DIAGNOSIS — O0993 Supervision of high risk pregnancy, unspecified, third trimester: Secondary | ICD-10-CM

## 2016-04-05 DIAGNOSIS — L309 Dermatitis, unspecified: Secondary | ICD-10-CM

## 2016-04-05 LAB — POCT URINALYSIS DIP (DEVICE)
BILIRUBIN URINE: NEGATIVE
Glucose, UA: NEGATIVE mg/dL
Ketones, ur: NEGATIVE mg/dL
LEUKOCYTES UA: NEGATIVE
NITRITE: NEGATIVE
Protein, ur: NEGATIVE mg/dL
Specific Gravity, Urine: 1.02 (ref 1.005–1.030)
Urobilinogen, UA: 0.2 mg/dL (ref 0.0–1.0)
pH: 7 (ref 5.0–8.0)

## 2016-04-05 MED ORDER — HYDROXYZINE HCL 25 MG PO TABS: 25.0000 mg | ORAL_TABLET | Freq: Four times a day (QID) | ORAL | 2 refills | Status: DC | PRN

## 2016-04-05 MED ORDER — URSODIOL 500 MG PO TABS: 500.0000 mg | ORAL_TABLET | Freq: Two times a day (BID) | ORAL | 0 refills | Status: DC

## 2016-04-05 NOTE — Progress Notes (Signed)
Session Start time: 1:31   End Time: 1:42 Total Time:  11 minutes Type of Service: Behavioral Health - Individual/Family Interpreter: Yes.     Interpreter Name & Language: Glean SalenSpanish, Mariel # [facility]  SUBJECTIVE: Beth Brown is a 36 y.o. female  Pt. was referred by F/u(Ervin) for:  anxiety and depression. Pt. reports the following symptoms/concerns: Pt states that since last visit, she has been going outside almost daily, and planning activities out of the house, and that this has helped to her feel much better; feels like feelings of anxiety and depression are going down, "I feel a lot better".  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 Pt may benefit from brief therapeutic intervention today.   PLAN: 1. F/U with behavioral health clinician: One month, if symptoms do not decrease 2. Behavioral Health meds: none 3. Behavioral recommendations:  -Continue spending time outside daily, and planning activities outside of the home daily, as long as these activities remain helpful in reducing symptoms of anxiety and  depression 4. Referral: Brief Counseling/Psychotherapy 5. From scale of 1-10, how likely are you to follow plan: 9  Woc-Behavioral Health Clinician  Behavioral Health Clinician  Warmhandoff: no  Depression screen [facility] 2/9 04/01/2016 03/26/2016 03/24/2016 03/10/2016 03/02/2016  Decreased Interest 1 1 2 1  0  Down, Depressed, Hopeless 0 1 0 0 0  PHQ - 2 Score 1 2 2 1  0  Altered sleeping 2 2 2 1  -  Tired, decreased energy 2 2 3 1  -  Change in appetite 2 1 2  0 -  Feeling bad or failure about yourself  2 1 2 2  -  Trouble concentrating 2 2 1 1  -  Moving slowly or fidgety/restless 2 3 2 1  -  Suicidal thoughts 0 0 0 0 -  PHQ-9 Score 13 13 14 7  -   GAD 7 : Generalized Anxiety Score 04/01/2016 03/26/2016 03/24/2016 03/10/2016  Nervous, Anxious, on Edge 1 1 1 2   Control/stop worrying 2 1 1 2   Worry too much - different things 1 1 1 1   Trouble relaxing 1 1 1 1   Restless 1 1 2 2   Easily annoyed or irritable 3 3 3 2   Afraid - awful might happen 1 2 2 1   Total GAD 7 Score 10 10 11  11

## 2016-04-05 NOTE — Progress Notes (Signed)
   PRENATAL VISIT NOTE  Subjective:  Beth Brown is a 36 y.o. [phone] at 3270w6d being seen today for ongoing prenatal care.  She is currently monitored for the following issues for this high-risk pregnancy and has DYSTHYMIC DISORDER; HEMORRHOIDS, NOS; HEARTBURN; Family planning; Acne; Keratitis; Supervision of normal pregnancy, antepartum; Cholestasis during pregnancy in third trimester; and GBS bacteriuria on her problem list.  Patient reports itching with rash. Extensively discussed this with patient - Rash with pruritis started on abdomen, spread to arms. Never had itching on hands or feet. This started back in November. Had bile acids - which was 11. This was obtained in November, then repeated in January. She was diagnosed with Cholestasis, although her pruritis has been improving before starting Actigall. She saw MFM for US - Dr Sherrie Georgeecker did not thing that her exam matched the diagnosis of Cholestasis. The patient has remained in testing, however.  Contractions: Irregular. Vag. Bleeding: None.  Movement: Present. Denies leaking of fluid.   The following portions of the patient's history were reviewed and updated as appropriate: allergies, current medications, past family history, past medical history, past social history, past surgical history and problem list. Problem list updated.  Objective:   Vitals:   04/05/16 1303  BP: 117/68  Pulse: 78  Weight: 178 lb 3.2 oz (80.8 kg)    Fetal Status: Fetal Heart Rate (bpm): NST   Movement: Present     General:  Alert, oriented and cooperative. Patient is in no acute distress.  Skin: Skin is warm and dry. No rash noted.   Cardiovascular: Normal heart rate noted  Respiratory: Normal respiratory effort, no problems with respiration noted  Abdomen: Soft, gravid, appropriate for gestational age. Pain/Pressure: Present. Urticaric rash on abdomen and arms.    Pelvic:  Cervical exam deferred        Extremities: Normal range of motion.       Mental Status: Normal mood and affect. Normal behavior. Normal judgment and thought content.   Assessment and Plan:  Pregnancy: G4P3003 at 3670w6d  1. Supervision of high risk pregnancy in third trimester FHT and FH normal.   2. Pruritic urticarial papules and plaques of pregnancy, antepartum NST already done - reactive. I discussed this patient extensively with Dr Sherrie Georgeecker. Upper normal of bile acids is 15 in adults. The exam and history is NOT consistent with cholestasis. I also discussed this with Dr Marice Potterove. All three of us were in agreement that this patient does not meet the diagnosis of cholestasis and does not need to continue with testing. Will treat the patient for PEP with visteril. F/u in 2 weeks. - Fetal nonstress test  Preterm labor symptoms and general obstetric precautions including but not limited to vaginal bleeding, contractions, leaking of fluid and fetal movement were reviewed in detail with the patient. Please refer to After Visit Summary for other counseling recommendations.  Return in about 2 weeks (around 04/19/2016) for OB f/u.   Levie HeritageJacob J Stinson, DO

## 2016-04-05 NOTE — Progress Notes (Signed)
Interpreter Mariel present for encounter, however pt speaks and understand English very well. Pt continues to have severe itching of arms and abdomen. Pt requested refill of Ursodiol - Rx sent to pharmacy. Pt is also scheduled to have follow up with Asher MuirJamie today.

## 2016-04-08 ENCOUNTER — Other Ambulatory Visit: Payer: Self-pay | Admitting: Obstetrics & Gynecology

## 2016-04-19 ENCOUNTER — Ambulatory Visit (INDEPENDENT_AMBULATORY_CARE_PROVIDER_SITE_OTHER): Payer: Self-pay | Admitting: Obstetrics & Gynecology

## 2016-04-19 VITALS — BP 119/57 | HR 68 | Wt 175.2 lb

## 2016-04-19 DIAGNOSIS — O26613 Liver and biliary tract disorders in pregnancy, third trimester: Secondary | ICD-10-CM

## 2016-04-19 DIAGNOSIS — Z3480 Encounter for supervision of other normal pregnancy, unspecified trimester: Secondary | ICD-10-CM

## 2016-04-19 DIAGNOSIS — K831 Obstruction of bile duct: Secondary | ICD-10-CM

## 2016-04-19 DIAGNOSIS — Z3490 Encounter for supervision of normal pregnancy, unspecified, unspecified trimester: Secondary | ICD-10-CM

## 2016-04-19 DIAGNOSIS — Z113 Encounter for screening for infections with a predominantly sexual mode of transmission: Secondary | ICD-10-CM

## 2016-04-19 LAB — OB RESULTS CONSOLE GC/CHLAMYDIA: GC PROBE AMP, GENITAL: NEGATIVE

## 2016-04-19 NOTE — Progress Notes (Signed)
Spanish Interpreter [name]    PRENATAL VISIT NOTE  Subjective:  Beth Brown is a 36 y.o. [phone] at 593w6d being seen today for ongoing prenatal care.  She is currently monitored for the following issues for this high-risk pregnancy and has DYSTHYMIC DISORDER; HEMORRHOIDS, NOS; HEARTBURN; Family planning; Acne; Keratitis; Supervision of normal pregnancy, antepartum; Cholestasis during pregnancy in third trimester; and GBS bacteriuria on her problem list.  Patient reports increased itching and now on palm (right > left).  Contractions: Irregular. Vag. Bleeding: None.  Movement: Present. Denies leaking of fluid.   The following portions of the patient's history were reviewed and updated as appropriate: allergies, current medications, past family history, past medical history, past social history, past surgical history and problem list. Problem list updated.  Objective:   Vitals:   04/19/16 1134  BP: (!) 119/57  Pulse: 68  Weight: 175 lb 3.2 oz (79.5 kg)    Fetal Status: Fetal Heart Rate (bpm): 158   Movement: Present     General:  Alert, oriented and cooperative. Patient is in no acute distress.  Skin: Skin is warm and dry. No rash noted.   Cardiovascular: Normal heart rate noted  Respiratory: Normal respiratory effort, no problems with respiration noted  Abdomen: Soft, gravid, appropriate for gestational age. Pain/Pressure: Present     Pelvic:  Cervical exam deferred        Extremities: Normal range of motion.  Edema: None  Mental Status: Normal mood and affect. Normal behavior. Normal judgment and thought content.   Assessment and Plan:  Pregnancy: G4P3003 at 513w6d  1. Encounter for supervision of low-risk pregnancy, antepartum - [facility]   2. Cholestasis during pregnancy in third trimester Pt is experiencing more itching and now on palms.  Bile salts redrawn and NST done.  Will base treatment plan on results.   - Bile  acids, total - Fetal nonstress test  Preterm labor symptoms and general obstetric precautions including but not limited to vaginal bleeding, contractions, leaking of fluid and fetal movement were reviewed in detail with the patient. Please refer to After Visit Summary for other counseling recommendations.  Return in about 7 days (around 04/26/2016) for Ob fu .   Lesly DukesKelly H Priya Matsen, MD

## 2016-04-20 LAB — GC/CHLAMYDIA PROBE AMP (~~LOC~~) NOT AT ARMC
Chlamydia: NEGATIVE
Neisseria Gonorrhea: NEGATIVE

## 2016-04-21 ENCOUNTER — Telehealth: Payer: Self-pay | Admitting: *Deleted

## 2016-04-21 LAB — BILE ACIDS, TOTAL: Bile Acids Total: 9 umol/L (ref 4.7–24.5)

## 2016-04-21 NOTE — Telephone Encounter (Addendum)
Called pt with [employer] interpreter # [phone] and was unable to leave a message. Pt needs to be informed that her test from 3/19 shows that she definitely does not have Cholestasis. NST testing is not necessary. She should keep appt as scheduled on 3/26.   3/22  1040  Called pt w/[employer] interpreter # [phone] and informed her of test results indicating she does not have Cholestasis and she does not require fetal monitoring. She will have weekly visits for routine prenatal care until delivery.  Pt voiced understanding.

## 2016-04-26 ENCOUNTER — Ambulatory Visit (INDEPENDENT_AMBULATORY_CARE_PROVIDER_SITE_OTHER): Payer: Self-pay | Admitting: Obstetrics & Gynecology

## 2016-04-26 ENCOUNTER — Encounter: Payer: Self-pay | Admitting: Obstetrics & Gynecology

## 2016-04-26 VITALS — BP 131/65 | HR 84 | Wt 178.3 lb

## 2016-04-26 DIAGNOSIS — Z3480 Encounter for supervision of other normal pregnancy, unspecified trimester: Secondary | ICD-10-CM

## 2016-04-26 DIAGNOSIS — Z3483 Encounter for supervision of other normal pregnancy, third trimester: Secondary | ICD-10-CM

## 2016-04-26 NOTE — Progress Notes (Signed)
   PRENATAL VISIT NOTE  Subjective:  Beth Brown is a 36 y.o. [phone] at 5238w6d being seen today for ongoing prenatal care.  She is currently monitored for the following issues for this low-risk pregnancy and has DYSTHYMIC DISORDER; HEMORRHOIDS, NOS; Family planning; Acne; Keratitis; Supervision of normal pregnancy, antepartum; and GBS bacteriuria on her problem list.  Patient reports occasional contractions.  Contractions: Irregular. Vag. Bleeding: None.  Movement: Present. Denies leaking of fluid.   The following portions of the patient's history were reviewed and updated as appropriate: allergies, current medications, past family history, past medical history, past social history, past surgical history and problem list. Problem list updated.  Objective:   Vitals:   04/26/16 1256  BP: 131/65  Pulse: 84  Weight: 178 lb 4.8 oz (80.9 kg)    Fetal Status: Fetal Heart Rate (bpm): 150   Movement: Present     General:  Alert, oriented and cooperative. Patient is in no acute distress.  Skin: Skin is warm and dry. No rash noted.   Cardiovascular: Normal heart rate noted  Respiratory: Normal respiratory effort, no problems with respiration noted  Abdomen: Soft, gravid, appropriate for gestational age. Pain/Pressure: Present     Pelvic:  Cervical exam performed        Extremities: Normal range of motion.  Edema: None  Mental Status: Normal mood and affect. Normal behavior. Normal judgment and thought content.   Assessment and Plan:  Pregnancy: G4P3003 at 538w6d  1. Supervision of other normal pregnancy, antepartum   Preterm labor symptoms and general obstetric precautions including but not limited to vaginal bleeding, contractions, leaking of fluid and fetal movement were reviewed in detail with the patient. Please refer to After Visit Summary for other counseling recommendations.  Return in about 1 week (around 05/03/2016).    Adam PhenixJames G Luxe Cuadros, MD

## 2016-04-26 NOTE — Patient Instructions (Signed)
Información sobre el dispositivo intrauterino  (Intrauterine Device Information)  Un dispositivo intrauterino (DIU) se inserta en el útero e impide el embarazo. Hay dos tipos de DIU:  · DIU de cobre: este tipo de DIU está recubierto con un alambre de cobre y se inserta dentro del útero. El cobre hace que el útero y las trompas de Falop[place] produzcan un liquido que destruye los espermatozoides. El DIU de cobre puede permanecer en el lugar durante 10 años.  · DIU con hormona: este tipo de DIU contiene la hormona progestina (progesterona sintética). Las hormonas hacen que el moco cervical se haga más espeso, lo que evita que el esperma ingrese al útero. También hace que la membrana que recubre internamente al útero sea más delgada lo que impide el implante del óvulo fertilizado. La hormona debilita o destruye los espermatozoides que ingresan al útero. Alguno de los tipos de DIU hormonal pueden permanecer en el lugar durante 5 años y otros tipos pueden dejarse en el lugar por 3 años.  El médico se asegurará de que usted sea una buena candidata para usar el DIU. Converse con su médico acerca de los posibles efectos secundarios.  VENTAJASDEL DISPOSITIVO INTRAUTERINO  · El DIU es muy eficaz, reversible, de acción prolongada y de bajo mantenimiento.  · No hay efectos secundarios relacionados con el estrógeno.  · El DIU puede ser utilizado durante la lactancia.  · No está asociado con el aumento de peso.  · Funciona inmediatamente después de la inserción.  · El DIU hormonal funciona inmediatamente si se inserta dentro de los 7 días del inicio del período. Será necesario que utilice un método anticonceptivo adicional durante 7 días si el DIU hormonal se inserta en algún otro momento del ciclo.  · El DIU de cobre no interfiere con las hormonas femeninas.  · El DIU hormonal puede hacer que los períodos menstruales abundantes se hagan más ligeros y que haya menos cólicos.  · El DIU hormonal puede usarse durante 3 a 5 años.   · El DIU de cobre puede usarse durante 10 años.    DESVENTAJASDEL DISPOSITIVO INTRAUTERINO  · El DIU hormonal puede estar asociado con patrones de sangrado irregular.  · El DIU de cobre puede hacer que el flujo menstrual más abundante y doloroso.  · Puede experimentar cólicos y sangrado vaginal después de la inserción.    Esta información no tiene como fin reemplazar el consejo del médico. Asegúrese de hacerle al médico cualquier pregunta que tenga.  Document Released: 07/08/2009 Document Revised: 05/12/2015 Document Reviewed: 07/09/2012  Elsevier Interactive Patient Education © 2017 Elsevier Inc.

## 2016-05-03 ENCOUNTER — Ambulatory Visit (INDEPENDENT_AMBULATORY_CARE_PROVIDER_SITE_OTHER): Payer: Medicaid Other | Admitting: Family Medicine

## 2016-05-03 VITALS — BP 131/68 | HR 83 | Wt 179.2 lb

## 2016-05-03 DIAGNOSIS — O9989 Other specified diseases and conditions complicating pregnancy, childbirth and the puerperium: Secondary | ICD-10-CM

## 2016-05-03 DIAGNOSIS — Z3480 Encounter for supervision of other normal pregnancy, unspecified trimester: Secondary | ICD-10-CM

## 2016-05-03 DIAGNOSIS — O2686 Pruritic urticarial papules and plaques of pregnancy (PUPPP): Secondary | ICD-10-CM

## 2016-05-03 DIAGNOSIS — R8271 Bacteriuria: Secondary | ICD-10-CM

## 2016-05-03 DIAGNOSIS — N898 Other specified noninflammatory disorders of vagina: Secondary | ICD-10-CM

## 2016-05-03 DIAGNOSIS — Z3483 Encounter for supervision of other normal pregnancy, third trimester: Secondary | ICD-10-CM

## 2016-05-03 MED ORDER — TRIAMCINOLONE ACETONIDE 0.1 % EX OINT: 1.0000 "application " | TOPICAL_OINTMENT | Freq: Three times a day (TID) | CUTANEOUS | 1 refills | Status: DC

## 2016-05-03 NOTE — Progress Notes (Signed)
Patient reports new onset headaches but has also been congested. Also reports white discharge with odor. Patient needs refill on kenalog cream

## 2016-05-03 NOTE — Progress Notes (Signed)
   PRENATAL VISIT NOTE  Subjective:  Beth Brown is a 36 y.o. [phone] at [redacted]w[redacted]d being seen today for ongoing prenatal care.  She is currently monitored for the following issues for this low-risk pregnancy and has DYSTHYMIC DISORDER; HEMORRHOIDS, NOS; Family planning; Acne; Keratitis; Supervision of normal pregnancy, antepartum; and GBS bacteriuria on her problem list.  Patient reports no complaints.  Contractions: Irregular. Vag. Bleeding: None.  Movement: Present. Denies leaking of fluid.   The following portions of the patient's history were reviewed and updated as appropriate: allergies, current medications, past family history, past medical history, past social history, past surgical history and problem list. Problem list updated.  Objective:   Vitals:   05/03/16 1404  BP: 131/68  Pulse: 83  Weight: 179 lb 3.2 oz (81.3 kg)    Fetal Status: Fetal Heart Rate (bpm): 137   Movement: Present  Presentation: Vertex  General:  Alert, oriented and cooperative. Patient is in no acute distress.  Skin: Skin is warm and dry. No rash noted.   Cardiovascular: Normal heart rate noted  Respiratory: Normal respiratory effort, no problems with respiration noted  Abdomen: Soft, gravid, appropriate for gestational age. Pain/Pressure: Present     Pelvic:  Cervical exam deferred Dilation: Closed Effacement (%): 30    Extremities: Normal range of motion.  Edema: Trace  Mental Status: Normal mood and affect. Normal behavior. Normal judgment and thought content.   Assessment and Plan:  Pregnancy: G4P3003 at [redacted]w[redacted]d  1. Supervision of other normal pregnancy, antepartum FHT and FH normal - Cervicovaginal ancillary only  2. PUPP (pruritic urticarial papules and plaques of pregnancy) Continue triamcinolone - triamcinolone ointment (KENALOG) 0.1 %; Apply 1 application topically 3 (three) times daily. To affected area on skin for 2-4 weeks.  Dispense: 80 g; Refill: 1  3. GBS bacteriuria Will  repeat swab to check for sensitivities due to PCN allergy. If negative, will be presumed false negative and treat with Vancomycin  Term labor symptoms and general obstetric precautions including but not limited to vaginal bleeding, contractions, leaking of fluid and fetal movement were reviewed in detail with the patient. Please refer to After Visit Summary for other counseling recommendations.  No Follow-up on file.   Levie Heritage, DO

## 2016-05-04 LAB — CERVICOVAGINAL ANCILLARY ONLY
BACTERIAL VAGINITIS: NEGATIVE
Candida vaginitis: NEGATIVE
Trichomonas: NEGATIVE

## 2016-05-08 LAB — STREP GP B CULTURE+RFLX: Strep Gp B Culture+Rflx: POSITIVE — AB

## 2016-05-08 LAB — STREP GP B SUSCEPTIBILITY

## 2016-05-10 ENCOUNTER — Ambulatory Visit: Payer: Self-pay

## 2016-05-10 ENCOUNTER — Ambulatory Visit (INDEPENDENT_AMBULATORY_CARE_PROVIDER_SITE_OTHER): Payer: Medicaid Other | Admitting: Family Medicine

## 2016-05-10 VITALS — BP 135/66 | HR 66 | Wt 181.4 lb

## 2016-05-10 DIAGNOSIS — Z3480 Encounter for supervision of other normal pregnancy, unspecified trimester: Secondary | ICD-10-CM

## 2016-05-10 DIAGNOSIS — R8271 Bacteriuria: Secondary | ICD-10-CM

## 2016-05-10 DIAGNOSIS — O368130 Decreased fetal movements, third trimester, not applicable or unspecified: Secondary | ICD-10-CM

## 2016-05-10 DIAGNOSIS — Z3483 Encounter for supervision of other normal pregnancy, third trimester: Secondary | ICD-10-CM

## 2016-05-10 NOTE — Progress Notes (Signed)
   PRENATAL VISIT NOTE  Subjective:  Beth Brown is a 36 y.o. [phone] at [redacted]w[redacted]d being seen today for ongoing prenatal care.  She is currently monitored for the following issues for this low-risk pregnancy and has DYSTHYMIC DISORDER; HEMORRHOIDS, NOS; Family planning; Acne; Keratitis; Supervision of normal pregnancy, antepartum; and GBS bacteriuria on her problem list.  Patient reports no complaints.  Contractions: Irregular. Vag. Bleeding: None.  Movement: (!) Decreased. Denies leaking of fluid.   The following portions of the patient's history were reviewed and updated as appropriate: allergies, current medications, past family history, past medical history, past social history, past surgical history and problem list. Problem list updated.  Objective:   Vitals:   05/10/16 0931  BP: 135/66  Pulse: 66  Weight: 181 lb 6.4 oz (82.3 kg)    Fetal Status: Fetal Heart Rate (bpm): 150 Fundal Height: 39 cm Movement: (!) Decreased  Presentation: Undeterminable  General:  Alert, oriented and cooperative. Patient is in no acute distress.  Skin: Skin is warm and dry. No rash noted.   Cardiovascular: Normal heart rate noted  Respiratory: Normal respiratory effort, no problems with respiration noted  Abdomen: Soft, gravid, appropriate for gestational age. Pain/Pressure: Present     Pelvic:  Cervical exam performed Dilation: Closed Effacement (%): 30 Station: Ballotable  Extremities: Normal range of motion.  Edema: Mild pitting, slight indentation  Mental Status: Normal mood and affect. Normal behavior. Normal judgment and thought content.  NST reviewed and reactive. Limited bedside u/s reveals vtx presentation Assessment and Plan:  Pregnancy: G4P3003 at [redacted]w[redacted]d  1. Decreased fetal movements in third trimester, single or unspecified fetus Reactive NST - Fetal nonstress test  2. GBS bacteriuria Will need treatment in labor  3. Supervision of other normal pregnancy,  antepartum Continue routine prenatal care.   Term labor symptoms and general obstetric precautions including but not limited to vaginal bleeding, contractions, leaking of fluid and fetal movement were reviewed in detail with the patient. Please refer to After Visit Summary for other counseling recommendations.  Return in 1 week (on 05/17/2016).   Reva Bores, MD

## 2016-05-10 NOTE — BH Specialist Note (Deleted)
Integrated Behavioral Health Follow Up Visit  MRN: [id number] Name: Beth Brown [facility]   Session Start time: *** Session End time: *** Total time: {IBH Total Time:21014050} Number of Integrated Behavioral Health Clinician visits: {IBH Number of Visits:21014052}  Type of Service: Integrated Behavioral Health- Individual/Family Interpretor:{yes WU:[id number]} Interpretor Name and Language: ***   Warm Hand Off Completed.       SUBJECTIVE: Beth Brown is a 36 y.o. female accompanied by {Persons; PED relatives w/patient:19415}. Patient was referred by *** for ***. Patient reports the following symptoms/concerns: *** Duration of problem: ***; Severity of problem: {Mild/Moderate/Severe:20260}  OBJECTIVE: Mood: {BHH MOOD:22306} and Affect: {BHH AFFECT:22307} Risk of harm to self or others: {CHL AMB BH Suicide Current Mental Status:21022748}   LIFE CONTEXT: Family and Social: *** School/Work: *** Self-Care: *** Life Changes: ***  GOALS ADDRESSED: Patient will reduce symptoms of: {IBH Symptoms:21014056} and increase knowledge and/or ability of: {IBH Patient Tools:21014057} and also: {IBH Goals:21014053}  INTERVENTIONS: {IBH Interventions:21014054} Standardized Assessments completed: {IBH Screening Tools:21014051}  ASSESSMENT: Patient currently experiencing ***. Patient may benefit from ***.  PLAN: 1. Follow up with behavioral health clinician on : *** 2. Behavioral recommendations: *** 3. Referral(s): {IBH Referrals:21014055} 4. "From scale of 1-10, how likely are you to follow plan?": ***  Rae Lips, LCSWA  Depression screen [facility] 2/9 05/10/2016 05/03/2016 04/26/2016 04/19/2016 04/01/2016  Decreased Interest 1 1 0 1 1  Down, Depressed, Hopeless 0 0 0 0 0  PHQ - 2 Score 1 1 0 1 1  Altered sleeping Tired, decreased energy Change in appetite Feeling bad or failure about yourself  Trouble concentrating Moving slowly or fidgety/restless Suicidal thoughts 0 0 0 0 0  PHQ-9 Score Some recent data might be hidden   GAD 7 : Generalized Anxiety Score 05/10/2016 05/03/2016 04/26/2016 04/19/2016  Nervous, Anxious, on Edge Control/stop worrying Worry too much - different things Trouble relaxing Restless 0  Easily annoyed or irritable Afraid - awful might happen Total GAD 7 Score 7

## 2016-05-10 NOTE — Patient Instructions (Signed)
Tercer trimestre de embarazo (Third Trimester of Pregnancy) El tercer trimestre comprende desde la semana29 hasta la semana42, es decir, desde el mes7 hasta el mes9. El tercer trimestre es un perodo en el que el feto crece rpidamente. Hacia el final del noveno mes, el feto mide alrededor de 20pulgadas (45cm) de largo y pesa entre 6 y 10 libras (2,700 y 4,500kg). CAMBIOS EN EL ORGANISMO Su organismo atraviesa por muchos cambios durante el embarazo, y estos varan de una mujer a otra.  Seguir aumentando de peso. Es de esperar que aumente entre 25 y 35libras (11 y 16kg) hacia el final del embarazo.  Podrn aparecer las primeras estras en las caderas, el abdomen y las mamas.  Puede tener necesidad de orinar con ms frecuencia porque el feto baja hacia la pelvis y ejerce presin sobre la vejiga.  Debido al embarazo podr sentir acidez estomacal con frecuencia.  Puede estar estreida, ya que ciertas hormonas enlentecen los movimientos de los msculos que empujan los desechos a travs de los intestinos.  Pueden aparecer hemorroides o abultarse e hincharse las venas (venas varicosas).  Puede sentir dolor plvico debido al aumento de peso y a que las hormonas del embarazo relajan las articulaciones entre los huesos de la pelvis. El dolor de espalda puede ser consecuencia de la sobrecarga de los msculos que soportan la postura.  Tal vez haya cambios en el cabello que pueden incluir su engrosamiento, crecimiento rpido y cambios en la textura. Adems, a algunas mujeres se les cae el cabello durante o despus del embarazo, o tienen el cabello seco o fino. Lo ms probable es que el cabello se le normalice despus del nacimiento del beb.  Las mamas seguirn creciendo y le dolern. A veces, puede haber una secrecin amarilla de las mamas llamada calostro.  El ombligo puede salir hacia afuera.  Puede sentir que le falta el aire debido a que se expande el tero.  Puede notar que el feto  "baja" o lo siente ms bajo, en el abdomen.  Puede tener una prdida de secrecin mucosa con sangre. Esto suele ocurrir en el trmino de unos pocos das a una semana antes de que comience el trabajo de parto.  El cuello del tero se vuelve delgado y blando (se borra) cerca de la fecha de parto. QU DEBE ESPERAR EN LOS EXMENES PRENATALES Le harn exmenes prenatales cada 2semanas hasta la semana36. A partir de ese momento le harn exmenes semanales. Durante una visita prenatal de rutina:  La pesarn para asegurarse de que usted y el feto estn creciendo normalmente.  Le tomarn la presin arterial.  Le medirn el abdomen para controlar el desarrollo del beb.  Se escucharn los latidos cardacos fetales.  Se evaluarn los resultados de los estudios solicitados en visitas anteriores.  Le revisarn el cuello del tero cuando est prxima la fecha de parto para controlar si este se ha borrado. Alrededor de la semana36, el mdico le revisar el cuello del tero. Al mismo tiempo, realizar un anlisis de las secreciones del tejido vaginal. Este examen es para determinar si hay un tipo de bacteria, estreptococo Grupo B. El mdico le explicar esto con ms detalle. El mdico puede preguntarle lo siguiente:  Cmo le gustara que fuera el parto.  Cmo se siente.  Si siente los movimientos del beb.  Si ha tenido sntomas anormales, como prdida de lquido, sangrado, dolores de cabeza intensos o clicos abdominales.  Si est consumiendo algn producto que contenga tabaco, como cigarrillos, tabaco de mascar y   cigarrillos electrnicos.  Si tiene alguna pregunta. Otros exmenes o estudios de deteccin que pueden realizarse durante el tercer trimestre incluyen lo siguiente:  Anlisis de sangre para controlar los niveles de hierro (anemia).  Controles fetales para determinar su salud, nivel de actividad y crecimiento. Si tiene alguna enfermedad o hay problemas durante el embarazo, le harn  estudios.  Prueba del VIH (virus de inmunodeficiencia humana). Si corre un riesgo alto, pueden realizarle una prueba de deteccin del VIH durante el tercer trimestre del embarazo. FALSO TRABAJO DE PARTO Es posible que sienta contracciones leves e irregulares que finalmente desaparecen. Se llaman contracciones de Braxton Hicks o falso trabajo de parto. Las contracciones pueden durar horas, das o incluso semanas, antes de que el verdadero trabajo de parto se inicie. Si las contracciones ocurren a intervalos regulares, se intensifican o se hacen dolorosas, lo mejor es que la revise el mdico. SIGNOS DE TRABAJO DE PARTO  Clicos de tipo menstrual.  Contracciones cada 5minutos o menos.  Contracciones que comienzan en la parte superior del tero y se extienden hacia abajo, a la zona inferior del abdomen y la espalda.  Sensacin de mayor presin en la pelvis o dolor de espalda.  Una secrecin de mucosidad acuosa o con sangre que sale de la vagina. Si tiene alguno de estos signos antes de la semana37 del embarazo, llame a su mdico de inmediato. Debe concurrir al hospital para que la controlen inmediatamente. INSTRUCCIONES PARA EL CUIDADO EN EL HOGAR  Evite fumar, consumir hierbas, beber alcohol y tomar frmacos que no le hayan recetado. Estas sustancias qumicas afectan la formacin y el desarrollo del beb.  No consuma ningn producto que contenga tabaco, lo que incluye cigarrillos, tabaco de mascar y cigarrillos electrnicos. Si necesita ayuda para dejar de fumar, consulte al mdico. Puede recibir asesoramiento y otro tipo de recursos para dejar de fumar.  Siga las indicaciones del mdico en relacin con el uso de medicamentos. Durante el embarazo, hay medicamentos que son seguros de tomar y otros que no.  Haga ejercicio solamente como se lo haya indicado el mdico. Sentir clicos uterinos es un buen signo para detener la actividad fsica.  Contine comiendo alimentos sanos con  regularidad.  Use un sostn que le brinde buen soporte si le duelen las mamas.  No se d baos de inmersin en agua caliente, baos turcos ni saunas.  Use el cinturn de seguridad en todo momento mientras conduce.  No coma carne cruda ni queso sin cocinar; evite el contacto con las bandejas sanitarias de los gatos y la tierra que estos animales usan. Estos elementos contienen grmenes que pueden causar defectos congnitos en el beb.  Tome las vitaminas prenatales.  Tome entre 1500 y 2000mg de calcio diariamente comenzando en la semana20 del embarazo hasta el parto.  Si est estreida, pruebe un laxante suave (si el mdico lo autoriza). Consuma ms alimentos ricos en fibra, como vegetales y frutas frescos y cereales integrales. Beba gran cantidad de lquido para mantener la orina de tono claro o color amarillo plido.  Dese baos de asiento con agua tibia para aliviar el dolor o las molestias causadas por las hemorroides. Use una crema para las hemorroides si el mdico la autoriza.  Si tiene venas varicosas, use medias de descanso. Eleve los pies durante 15minutos, 3 o 4veces por da. Limite el consumo de sal en su dieta.  Evite levantar objetos pesados, use zapatos de tacones bajos y mantenga una buena postura.  Descanse con las piernas elevadas si tiene   calambres o dolor de cintura.  Visite a su dentista si no lo ha hecho durante el embarazo. Use un cepillo de dientes blando para higienizarse los dientes y psese el hilo dental con suavidad.  Puede seguir manteniendo relaciones sexuales, a menos que el mdico le indique lo contrario.  No haga viajes largos excepto que sea absolutamente necesario y solo con la autorizacin del mdico.  Tome clases prenatales para entender, practicar y hacer preguntas sobre el trabajo de parto y el parto.  Haga un ensayo de la partida al hospital.  Prepare el bolso que llevar al hospital.  Prepare la habitacin del beb.  Concurra a todas  las visitas prenatales segn las indicaciones de su mdico.  SOLICITE ATENCIN MDICA SI:  No est segura de que est en trabajo de parto o de que ha roto la bolsa de las aguas.  Tiene mareos.  Siente clicos leves, presin en la pelvis o dolor persistente en el abdomen.  Tiene nuseas, vmitos o diarrea persistentes.  Observa una secrecin vaginal con mal olor.  Siente dolor al orinar.  SOLICITE ATENCIN MDICA DE INMEDIATO SI:  Tiene fiebre.  Tiene una prdida de lquido por la vagina.  Tiene sangrado o pequeas prdidas vaginales.  Siente dolor intenso o clicos en el abdomen.  Sube o baja de peso rpidamente.  Tiene dificultad para respirar y siente dolor de pecho.  Sbitamente se le hinchan mucho el rostro, las manos, los tobillos, los pies o las piernas.  No ha sentido los movimientos del beb durante una hora.  Siente un dolor de cabeza intenso que no se alivia con medicamentos.  Su visin se modifica.  Esta informacin no tiene como fin reemplazar el consejo del mdico. Asegrese de hacerle al mdico cualquier pregunta que tenga. Document Released: 10/28/2004 Document Revised: 02/08/2014 Document Reviewed: 03/21/2012 Elsevier Interactive Patient Education  2017 [employer]   Lactancia materna (Breastfeeding) Decidir amamantar es una de las mejores elecciones que puede hacer por usted y su beb. El cambio hormonal durante el embarazo produce el desarrollo del tejido mamario y aumenta la cantidad y el tamao de los conductos galactforos. Estas hormonas tambin permiten que las protenas, los azcares y las grasas de la sangre produzcan la leche materna en las glndulas productoras de leche. Las hormonas impiden que la leche materna sea liberada antes del nacimiento del beb, adems de impulsar el flujo de leche luego del nacimiento. Una vez que ha comenzado a amamantar, pensar en el beb, as como la succin o el llanto, pueden estimular la liberacin de leche  de las glndulas productoras de leche. LOS BENEFICIOS DE AMAMANTAR Para el beb  La primera leche (calostro) ayuda a mejorar el funcionamiento del sistema digestivo del beb.  La leche tiene anticuerpos que ayudan a prevenir las infecciones en el beb.  El beb tiene una menor incidencia de asma, alergias y del sndrome de muerte sbita del lactante.  Los nutrientes en la leche materna son mejores para el beb que la leche maternizada y estn preparados exclusivamente para cubrir las necesidades del beb.  La leche materna mejora el desarrollo cerebral del beb.  Es menos probable que el beb desarrolle otras enfermedades, como obesidad infantil, asma o diabetes mellitus de tipo 2. Para usted  La lactancia materna favorece el desarrollo de un vnculo muy especial entre la madre y el beb.  Es conveniente. La leche materna siempre est disponible a la temperatura correcta y es econmica.  La lactancia materna ayuda a quemar caloras y   a perder el peso ganado durante el embarazo.  Favorece la contraccin del tero al tamao que tena antes del embarazo de manera ms rpida y disminuye el sangrado (loquios) despus del parto.  La lactancia materna contribuye a reducir el riesgo de desarrollar diabetes mellitus de tipo 2, osteoporosis o cncer de mama o de ovario en el futuro. SIGNOS DE QUE EL BEB EST HAMBRIENTO Primeros signos de hambre  Aumenta su estado de alerta o actividad.  Se estira.  Mueve la cabeza de un lado a otro.  Mueve la cabeza y abre la boca cuando se le toca la mejilla o la comisura de la boca (reflejo de bsqueda).  Aumenta las vocalizaciones, tales como sonidos de succin, se relame los labios, emite arrullos, suspiros, o chirridos.  Mueve la mano hacia la boca.  Se chupa con ganas los dedos o las manos. Signos tardos de hambre  Est agitado.  Llora de manera intermitente. Signos de hambre extrema Los signos de hambre extrema requerirn que lo calme y  lo consuele antes de que el beb pueda alimentarse adecuadamente. No espere a que se manifiesten los siguientes signos de hambre extrema para comenzar a amamantar:  Agitacin.  Llanto intenso y fuerte.  Gritos. INFORMACIN BSICA SOBRE LA LACTANCIA MATERNA Iniciacin de la lactancia materna  Encuentre un lugar cmodo para sentarse o acostarse, con un buen respaldo para el cuello y la espalda.  Coloque una almohada o una manta enrollada debajo del beb para acomodarlo a la altura de la mama (si est sentada). Las almohadas para amamantar se han diseado especialmente a fin de servir de apoyo para los brazos y el beb mientras amamanta.  Asegrese de que el abdomen del beb est frente al suyo.  Masajee suavemente la mama. Con las yemas de los dedos, masajee la pared del pecho hacia el pezn en un movimiento circular. Esto estimula el flujo de leche. Es posible que deba continuar este movimiento mientras amamanta si la leche fluye lentamente.  Sostenga la mama con el pulgar por arriba del pezn y los otros 4 dedos por debajo de la mama. Asegrese de que los dedos se encuentren lejos del pezn y de la boca del beb.  Empuje suavemente los labios del beb con el pezn o con el dedo.  Cuando la boca del beb se abra lo suficiente, acrquelo rpidamente a la mama e introduzca todo el pezn y la zona oscura que lo rodea (areola), tanto como sea posible, dentro de la boca del beb. ? Debe haber ms areola visible por arriba del labio superior del beb que por debajo del labio inferior. ? La lengua del beb debe estar entre la enca inferior y la mama.  Asegrese de que la boca del beb est en la posicin correcta alrededor del pezn (prendida). Los labios del beb deben crear un sello sobre la mama y estar doblados hacia afuera (invertidos).  Es comn que el beb succione durante 2 a 3 minutos para que comience el flujo de leche materna. Cmo debe prenderse Es muy importante que le ensee al  beb cmo prenderse adecuadamente a la mama. Si el beb no se prende adecuadamente, puede causarle dolor en el pezn y reducir la produccin de leche materna, y hacer que el beb tenga un escaso aumento de peso. Adems, si el beb no se prende adecuadamente al pezn, puede tragar aire durante la alimentacin. Esto puede causarle molestias al beb. Hacer eructar al beb al cambiar de mama puede ayudarlo a liberar el   aire. Sin embargo, ensearle al beb cmo prenderse a la mama adecuadamente es la mejor manera de evitar que se sienta molesto por tragar aire mientras se alimenta. Signos de que el beb se ha prendido adecuadamente al pezn:  Tironea o succiona de modo silencioso, sin causarle dolor.  Se escucha que traga cada 3 o 4 succiones.  Hay movimientos musculares por arriba y por delante de sus odos al succionar. Signos de que el beb no se ha prendido adecuadamente al pezn:  Hace ruidos de succin o de chasquido mientras se alimenta.  Siente dolor en el pezn. Si cree que el beb no se prendi correctamente, deslice el dedo en la comisura de la boca y colquelo entre las encas del beb para interrumpir la succin. Intente comenzar a amamantar nuevamente. Signos de lactancia materna exitosa Signos del beb:  Disminuye gradualmente el nmero de succiones o cesa la succin por completo.  Se duerme.  Relaja el cuerpo.  Retiene una pequea cantidad de leche en la boca.  Se desprende solo del pecho. Signos que presenta usted:  Las mamas han aumentado la firmeza, el peso y el tamao 1 a 3 horas despus de amamantar.  Estn ms blandas inmediatamente despus de amamantar.  Un aumento del volumen de leche, y tambin un cambio en su consistencia y color se producen hacia el quinto da de lactancia materna.  Los pezones no duelen, ni estn agrietados ni sangran. Signos de que su beb recibe la cantidad de leche suficiente  Mojar por lo menos 1 o 2 paales durante las primeras 24  horas despus del nacimiento.  Mojar por lo menos 5 o 6 paales cada 24 horas durante la primera semana despus del nacimiento. La orina debe ser transparente o de color amarillo plido a los 5 das despus del nacimiento.  Mojar entre 6 y 8 paales cada 24 horas a medida que el beb sigue creciendo y desarrollndose.  Defeca al menos 3 veces en 24 horas a los 5 das de vida. La materia fecal debe ser blanda y amarillenta.  Defeca al menos 3 veces en 24 horas a los 7 das de vida. La materia fecal debe ser grumosa y amarillenta.  No registra una prdida de peso mayor del 10% del peso al nacer durante los primeros 3 das de vida.  Aumenta de peso un promedio de 4 a 7onzas (113 a 198g) por semana despus de los 4 das de vida.  Aumenta de peso, diariamente, de manera uniforme a partir de los 5 das de vida, sin registrar prdida de peso despus de las 2semanas de vida. Despus de alimentarse, es posible que el beb regurgite una pequea cantidad. Esto es frecuente. FRECUENCIA Y DURACIN DE LA LACTANCIA MATERNA El amamantamiento frecuente la ayudar a producir ms leche y a prevenir problemas de dolor en los pezones e hinchazn en las mamas. Alimente al beb cuando muestre signos de hambre o si siente la necesidad de reducir la congestin de las mamas. Esto se denomina "lactancia a demanda". Evite el uso del chupete mientras trabaja para establecer la lactancia (las primeras 4 a 6 semanas despus del nacimiento del beb). Despus de este perodo, podr ofrecerle un chupete. Las investigaciones demostraron que el uso del chupete durante el primer ao de vida del beb disminuye el riesgo de desarrollar el sndrome de muerte sbita del lactante (SMSL). Permita que el nio se alimente en cada mama todo lo que desee. Contine amamantando al beb hasta que haya terminado de alimentarse. Cuando   el beb se desprende o se queda dormido mientras se est alimentando de la primera mama, ofrzcale la segunda.  Debido a que, con frecuencia, los recin nacidos permanecen somnolientos las primeras semanas de vida, es posible que deba despertar al beb para alimentarlo. Los horarios de lactancia varan de un beb a otro. Sin embargo, las siguientes reglas pueden servir como gua para ayudarla a garantizar que el beb se alimenta adecuadamente:  Se puede amamantar a los recin nacidos (bebs de 4 semanas o menos de vida) cada 1 a 3 horas.  No deben transcurrir ms de 3 horas durante el da o 5 horas durante la noche sin que se amamante a los recin nacidos.  Debe amamantar al beb 8 veces como mnimo en un perodo de 24 horas, hasta que comience a introducir slidos en su dieta, a los 6 meses de vida aproximadamente. EXTRACCIN DE LECHE MATERNA La extraccin y el almacenamiento de la leche materna le permiten asegurarse de que el beb se alimente exclusivamente de leche materna, aun en momentos en los que no puede amamantar. Esto tiene especial importancia si debe regresar al trabajo en el perodo en que an est amamantando o si no puede estar presente en los momentos en que el beb debe alimentarse. Su asesor en lactancia puede orientarla sobre cunto tiempo es seguro almacenar leche materna. El sacaleche es un aparato que le permite extraer leche de la mama a un recipiente estril. Luego, la leche materna extrada puede almacenarse en un refrigerador o congelador. Algunos sacaleches son manuales, mientras que otros son elctricos. Consulte a su asesor en lactancia qu tipo ser ms conveniente para usted. Los sacaleches se pueden comprar; sin embargo, algunos hospitales y grupos de apoyo a la lactancia materna alquilan sacaleches mensualmente. Un asesor en lactancia puede ensearle cmo extraer leche materna manualmente, en caso de que prefiera no usar un sacaleche. CMO CUIDAR LAS MAMAS DURANTE LA LACTANCIA MATERNA Los pezones se secan, agrietan y duelen durante la lactancia materna. Las siguientes  recomendaciones pueden ayudarla a mantener las mamas humectadas y sanas:  Evite usar jabn en los pezones.  Use un sostn de soporte. Aunque no son esenciales, las camisetas sin mangas o los sostenes especiales para amamantar estn diseados para acceder fcilmente a las mamas, para amamantar sin tener que quitarse todo el sostn o la camiseta. Evite usar sostenes con aro o sostenes muy ajustados.  Seque al aire sus pezones durante 3 a 4minutos despus de amamantar al beb.  Utilice solo apsitos de algodn en el sostn para absorber las prdidas de leche. La prdida de un poco de leche materna entre las tomas es normal.  Utilice lanolina sobre los pezones luego de amamantar. La lanolina ayuda a mantener la humedad normal de la piel. Si usa lanolina pura, no tiene que lavarse los pezones antes de volver a alimentar al beb. La lanolina pura no es txica para el beb. Adems, puede extraer manualmente algunas gotas de leche materna y masajear suavemente esa leche sobre los pezones, para que la leche se seque al aire. Durante las primeras semanas despus de dar a luz, algunas mujeres pueden experimentar hinchazn en las mamas (congestin mamaria). La congestin puede hacer que sienta las mamas pesadas, calientes y sensibles al tacto. El pico de la congestin ocurre dentro de los 3 a 5 das despus del parto. Las siguientes recomendaciones pueden ayudarla a aliviar la congestin:  Vace por completo las mamas al amamantar o extraer leche. Puede aplicar calor hmedo en las mamas (  en la ducha o con toallas hmedas para manos) antes de amamantar o extraer leche. Esto aumenta la circulacin y ayuda a que la leche fluya. Si el beb no vaca por completo las mamas cuando lo amamanta, extraiga la leche restante despus de que haya finalizado.  Use un sostn ajustado (para amamantar o comn) o una camiseta sin mangas durante 1 o 2 das para indicar al cuerpo que disminuya ligeramente la produccin de  leche.  Aplique compresas de hielo sobre las mamas, a menos que le resulte demasiado incmodo.  Asegrese de que el beb est prendido y se encuentre en la posicin correcta mientras lo alimenta. Si la congestin persiste luego de 48 horas o despus de seguir estas recomendaciones, comunquese con su mdico o un asesor en lactancia. RECOMENDACIONES GENERALES PARA EL CUIDADO DE LA SALUD DURANTE LA LACTANCIA MATERNA  Consuma alimentos saludables. Alterne comidas y colaciones, y coma 3 de cada una por da. Dado que lo que come afecta la leche materna, es posible que algunas comidas hagan que su beb se vuelva ms irritable de lo habitual. Evite comer este tipo de alimentos si percibe que afectan de manera negativa al beb.  Beba leche, jugos de fruta y agua para satisfacer su sed (aproximadamente 10 vasos al da).  Descanse con frecuencia, reljese y tome sus vitaminas prenatales para evitar la fatiga, el estrs y la anemia.  Contine con los autocontroles de la mama.  Evite masticar y fumar tabaco. Las sustancias qumicas de los cigarrillos que pasan a la leche materna y la exposicin al humo ambiental del tabaco pueden daar al beb.  No consuma alcohol ni drogas, incluida la marihuana. Algunos medicamentos, que pueden ser perjudiciales para el beb, pueden pasar a travs de la leche materna. Es importante que consulte a su mdico antes de tomar cualquier medicamento, incluidos todos los medicamentos recetados y de venta libre, as como los suplementos vitamnicos y herbales. Puede quedar embarazada durante la lactancia. Si desea controlar la natalidad, consulte a su mdico cules son las opciones ms seguras para el beb. SOLICITE ATENCIN MDICA SI:  Usted siente que quiere dejar de amamantar o se siente frustrada con la lactancia.  Siente dolor en las mamas o en los pezones.  Sus pezones estn agrietados o sangran.  Sus pechos estn irritados, sensibles o calientes.  Tiene un rea  hinchada en cualquiera de las mamas.  Siente escalofros o fiebre.  Tiene nuseas o vmitos.  Presenta una secrecin de otro lquido distinto de la leche materna de los pezones.  Sus mamas no se llenan antes de amamantar al beb para el quinto da despus del parto.  Se siente triste y deprimida.  El beb est demasiado somnoliento como para comer bien.  El beb tiene problemas para dormir.  Moja menos de 3 paales en 24 horas.  Defeca menos de 3 veces en 24 horas.  La piel del beb o la parte blanca de los ojos se vuelven amarillentas.  El beb no ha aumentado de peso a los 5 das de vida.  SOLICITE ATENCIN MDICA DE INMEDIATO SI:  El beb est muy cansado (letargo) y no se quiere despertar para comer.  Le sube la fiebre sin causa.  Esta informacin no tiene como fin reemplazar el consejo del mdico. Asegrese de hacerle al mdico cualquier pregunta que tenga. Document Released: 01/18/2005 Document Revised: 05/12/2015 Document Reviewed: 07/12/2012 Elsevier Interactive Patient Education  2017 [employer]  

## 2016-05-13 ENCOUNTER — Inpatient Hospital Stay (HOSPITAL_COMMUNITY)
Admission: AD | Admit: 2016-05-13 | Discharge: 2016-05-13 | Disposition: A | Discharge status: Home / Self Care | Payer: Self-pay | Source: Ambulatory Visit | Attending: Obstetrics & Gynecology | Admitting: Obstetrics & Gynecology

## 2016-05-13 DIAGNOSIS — Z3A40 40 weeks gestation of pregnancy: Secondary | ICD-10-CM | POA: Insufficient documentation

## 2016-05-13 DIAGNOSIS — O479 False labor, unspecified: Secondary | ICD-10-CM

## 2016-05-13 DIAGNOSIS — O26893 Other specified pregnancy related conditions, third trimester: Secondary | ICD-10-CM | POA: Insufficient documentation

## 2016-05-13 DIAGNOSIS — R102 Pelvic and perineal pain: Secondary | ICD-10-CM | POA: Insufficient documentation

## 2016-05-13 DIAGNOSIS — M545 Low back pain: Secondary | ICD-10-CM | POA: Insufficient documentation

## 2016-05-13 LAB — URINALYSIS, ROUTINE W REFLEX MICROSCOPIC
Bilirubin Urine: NEGATIVE
Glucose, UA: NEGATIVE mg/dL
Ketones, ur: NEGATIVE mg/dL
LEUKOCYTES UA: NEGATIVE
Nitrite: NEGATIVE
PH: 7 (ref 5.0–8.0)
Protein, ur: 30 mg/dL — AB
Specific Gravity, Urine: 1.009 (ref 1.005–1.030)

## 2016-05-13 NOTE — Discharge Instructions (Signed)
Contracciones de Braxton Hicks °(Braxton Hicks Contractions) °Durante el embarazo, pueden presentarse contracciones uterinas que no siempre indican que está en trabajo de parto. °¿QUÉ SON LAS CONTRACCIONES DE BRAXTON HICKS? °Las contracciones que se presentan antes del trabajo de parto se conocen como contracciones de Braxton Hicks o falso trabajo de parto. Hacia el final del embarazo (32 a 34 semanas), estas contracciones pueden aparecen con más frecuencia y volverse más intensas. No corresponden al trabajo de parto verdadero porque estas contracciones no producen el agrandamiento (la dilatación) y el afinamiento del cuello del útero. Algunas veces, es difícil distinguirlas del trabajo de parto verdadero porque en algunos casos pueden ser muy intensas, y las personas tienen diferentes niveles de tolerancia al dolor. No debe sentirse avergonzada si concurre al hospital con falso trabajo de parto. En ocasiones, la única forma de saber si el trabajo de parto es verdadero es que el médico determine si hay cambios en el cuello del útero. °Si no hay problemas prenatales u otras complicaciones de salud asociadas con el embarazo, no habrá inconvenientes si la envían a su casa con falso trabajo de parto y espera que comience el verdadero. °CÓMO DIFERENCIAR EL TRABAJO DE PARTO FALSO DEL VERDADERO °Falso trabajo de parto  °· Las contracciones del falso trabajo de parto duran menos y no son tan intensas como las verdaderas. °· Generalmente son irregulares. °· A menudo, se sienten en la parte delantera de la parte baja del abdomen y en la ingle, °· y pueden desaparecer cuando camina o cambia de posición mientras está acostada. °· Las contracciones se vuelven más débiles y su duración es menor a medida que el tiempo transcurre. °· Por lo general, no se hacen progresivamente más intensas, regulares y cercanas entre sí como en el caso del trabajo de parto verdadero. °Verdadero trabajo de parto  °· Las contracciones del verdadero  trabajo de parto duran de 30 a 70 segundos, son muy regulares y suelen volverse más intensas, y aumenta su frecuencia. °· No desaparecen cuando camina. °· La molestia generalmente se siente en la parte superior del útero y se extiende hacia la zona inferior del abdomen y hacia la cintura. °· El médico podrá examinarla para determinar si el trabajo de parto es verdadero. El examen mostrará si el cuello del útero se está dilatando y afinando. °LO QUE DEBE RECORDAR °· Continúe haciendo los ejercicios habituales y siga otras indicaciones que el médico le dé. °· Tome todos los medicamentos como le indicó el médico. °· Concurra a las visitas prenatales regulares. °· Coma y beba con moderación si cree que está en trabajo de parto. °· Si las contracciones de Braxton Hicks le provocan incomodidad: °¨ Cambie de posición: si está acostada o descansando, camine; si está caminando, descanse. °¨ Siéntese y descanse en una bañera con agua tibia. °¨ Beba 2 o 3 vasos de agua. La deshidratación puede provocar contracciones. °¨ Respire lenta y profundamente varias veces por hora. °¿CUÁNDO DEBO BUSCAR ASISTENCIA MÉDICA INMEDIATA? °Solicite atención médica de inmediato si: °· Las contracciones se intensifican, se hacen más regulares y cercanas entre sí. °· Tiene una pérdida de líquido por la vagina. °· Tiene fiebre. °· Elimina mucosidad manchada con sangre. °· Tiene una hemorragia vaginal abundante. °· Tiene dolor abdominal permanente. °· Tiene un dolor en la zona lumbar que nunca tuvo antes. °· Siente que la cabeza del bebé empuja hacia abajo y ejerce presión en la zona pélvica. °· El bebé no se mueve tanto como solía. °Esta información no tiene como fin reemplazar el   consejo del médico. Asegúrese de hacerle al médico cualquier pregunta que tenga. °Document Released: 10/28/2004 Document Revised: 05/12/2015 Document Reviewed: 10/30/2012 °Elsevier Interactive Patient Education © 2017 [employer] ° °

## 2016-05-13 NOTE — MAU Note (Signed)
Patient presents with complaints of vaginal pressure that started tonight and lower back pain that began 2 days ago.  Not currently feeling any contractions.  Reports good FM.  No VB or LOF.

## 2016-05-18 ENCOUNTER — Ambulatory Visit (INDEPENDENT_AMBULATORY_CARE_PROVIDER_SITE_OTHER): Payer: Self-pay | Admitting: Family Medicine

## 2016-05-18 VITALS — BP 138/59 | HR 69 | Wt 180.0 lb

## 2016-05-18 DIAGNOSIS — Z113 Encounter for screening for infections with a predominantly sexual mode of transmission: Secondary | ICD-10-CM

## 2016-05-18 DIAGNOSIS — Z3480 Encounter for supervision of other normal pregnancy, unspecified trimester: Secondary | ICD-10-CM

## 2016-05-18 DIAGNOSIS — R300 Dysuria: Secondary | ICD-10-CM

## 2016-05-18 DIAGNOSIS — O26893 Other specified pregnancy related conditions, third trimester: Secondary | ICD-10-CM

## 2016-05-18 DIAGNOSIS — N898 Other specified noninflammatory disorders of vagina: Secondary | ICD-10-CM

## 2016-05-18 DIAGNOSIS — Z3483 Encounter for supervision of other normal pregnancy, third trimester: Secondary | ICD-10-CM

## 2016-05-18 DIAGNOSIS — O2686 Pruritic urticarial papules and plaques of pregnancy (PUPPP): Secondary | ICD-10-CM

## 2016-05-18 LAB — POCT URINALYSIS DIP (DEVICE)
Bilirubin Urine: NEGATIVE
Glucose, UA: NEGATIVE mg/dL
Ketones, ur: NEGATIVE mg/dL
NITRITE: NEGATIVE
Protein, ur: 100 mg/dL — AB
Specific Gravity, Urine: 1.025 (ref 1.005–1.030)
Urobilinogen, UA: 1 mg/dL (ref 0.0–1.0)
pH: 7 (ref 5.0–8.0)

## 2016-05-18 MED ORDER — TRIAMCINOLONE ACETONIDE 0.1 % EX OINT: 1.0000 "application " | TOPICAL_OINTMENT | Freq: Three times a day (TID) | CUTANEOUS | 1 refills | Status: DC

## 2016-05-18 MED ORDER — CEPHALEXIN 500 MG PO CAPS: 500.0000 mg | ORAL_CAPSULE | Freq: Three times a day (TID) | ORAL | 0 refills | Status: DC

## 2016-05-18 MED FILL — CEPHALEXIN 500 MG CAPSULE: 500 | 7 days supply | Qty: 21 | Fill #0

## 2016-05-18 NOTE — Progress Notes (Signed)
Discharge with a little burning reported Spanish video interpreter "Abby" [phone] used

## 2016-05-18 NOTE — Progress Notes (Signed)
   PRENATAL VISIT NOTE  Subjective:  Beth Brown is a 36 y.o. [phone] at [redacted]w[redacted]d being seen today for ongoing prenatal care.  She is currently monitored for the following issues for this low-risk pregnancy and has DYSTHYMIC DISORDER; HEMORRHOIDS, NOS; Family planning; Acne; Keratitis; Supervision of normal pregnancy, antepartum; and GBS bacteriuria on her problem list.  Patient reports no complaints.  Contractions: Irregular. Vag. Bleeding: None.  Movement: (!) Decreased. Denies leaking of fluid.   The following portions of the patient's history were reviewed and updated as appropriate: allergies, current medications, past family history, past medical history, past social history, past surgical history and problem list. Problem list updated.  Objective:   Vitals:   05/18/16 1007  BP: (!) 138/59  Pulse: 69  Weight: 180 lb (81.6 kg)    Fetal Status: Fetal Heart Rate (bpm): 150 Fundal Height: 43 cm Movement: (!) Decreased  Presentation: Vertex  General:  Alert, oriented and cooperative. Patient is in no acute distress.  Skin: Skin is warm and dry. No rash noted.   Cardiovascular: Normal heart rate noted  Respiratory: Normal respiratory effort, no problems with respiration noted  Abdomen: Soft, gravid, appropriate for gestational age. Pain/Pressure: Present     Pelvic:  Cervical exam performed Dilation: 1.5 Effacement (%): 50 Station: -3  Extremities: Normal range of motion.  Edema: Trace  Mental Status: Normal mood and affect. Normal behavior. Normal judgment and thought content.   Assessment and Plan:  Pregnancy: G4P3003 at [redacted]w[redacted]d  1. Supervision of other normal pregnancy, antepartum Continue routine prenatal care. Reports decreased fetal movement, similar to last week. Begin testing in 3 days with AFI IOL scheduled at 41 wks.  - cephALEXin (KEFLEX) 500 MG capsule; Take 1 capsule (500 mg total) by mouth 3 (three) times daily.  Dispense: 21 capsule; Refill: 0  2.  Dysuria Urinalysis    Component Value Date/Time   COLORURINE STRAW (A) 05/13/2016 2240   APPEARANCEUR CLEAR 05/13/2016 2240   LABSPEC 1.025 05/18/2016 1016   PHURINE 7.0 05/18/2016 1016   GLUCOSEU NEGATIVE 05/18/2016 1016   HGBUR MODERATE (A) 05/18/2016 1016   HGBUR trace-intact 06/22/2007 1409   BILIRUBINUR NEGATIVE 05/18/2016 1016   BILIRUBINUR NEG 10/21/2015 1030   KETONESUR NEGATIVE 05/18/2016 1016   PROTEINUR 100 (A) 05/18/2016 1016   UROBILINOGEN 1.0 05/18/2016 1016   NITRITE NEGATIVE 05/18/2016 1016   LEUKOCYTESUR TRACE (A) 05/18/2016 1016     - Culture, OB Urine - cephALEXin (KEFLEX) 500 MG capsule; Take 1 capsule (500 mg total) by mouth 3 (three) times daily.  Dispense: 21 capsule; Refill: 0  3. PUPP (pruritic urticarial papules and plaques of pregnancy) Refilled meds - triamcinolone ointment (KENALOG) 0.1 %; Apply 1 application topically 3 (three) times daily. To affected area on skin for 2-4 weeks.  Dispense: 80 g; Refill: 1  4. Vaginal discharge during pregnancy in third trimester Check wet prep - Cervicovaginal ancillary only  Term labor symptoms and general obstetric precautions including but not limited to vaginal bleeding, contractions, leaking of fluid and fetal movement were reviewed in detail with the patient. Please refer to After Visit Summary for other counseling recommendations.  Return in 1 week (on 05/25/2016) for [facility], NST only in 4 days, OB visit and NST.   Reva Bores, MD

## 2016-05-18 NOTE — Patient Instructions (Signed)
Tercer trimestre de embarazo (Third Trimester of Pregnancy) El tercer trimestre comprende desde la semana29 hasta la semana42, es decir, desde el mes7 hasta el mes9. El tercer trimestre es un perodo en el que el feto crece rpidamente. Hacia el final del noveno mes, el feto mide alrededor de 20pulgadas (45cm) de largo y pesa entre 6 y 10 libras (2,700 y 4,500kg). CAMBIOS EN EL ORGANISMO Su organismo atraviesa por muchos cambios durante el embarazo, y estos varan de una mujer a otra.  Seguir aumentando de peso. Es de esperar que aumente entre 25 y 35libras (11 y 16kg) hacia el final del embarazo.  Podrn aparecer las primeras estras en las caderas, el abdomen y las mamas.  Puede tener necesidad de orinar con ms frecuencia porque el feto baja hacia la pelvis y ejerce presin sobre la vejiga.  Debido al embarazo podr sentir acidez estomacal con frecuencia.  Puede estar estreida, ya que ciertas hormonas enlentecen los movimientos de los msculos que empujan los desechos a travs de los intestinos.  Pueden aparecer hemorroides o abultarse e hincharse las venas (venas varicosas).  Puede sentir dolor plvico debido al aumento de peso y a que las hormonas del embarazo relajan las articulaciones entre los huesos de la pelvis. El dolor de espalda puede ser consecuencia de la sobrecarga de los msculos que soportan la postura.  Tal vez haya cambios en el cabello que pueden incluir su engrosamiento, crecimiento rpido y cambios en la textura. Adems, a algunas mujeres se les cae el cabello durante o despus del embarazo, o tienen el cabello seco o fino. Lo ms probable es que el cabello se le normalice despus del nacimiento del beb.  Las mamas seguirn creciendo y le dolern. A veces, puede haber una secrecin amarilla de las mamas llamada calostro.  El ombligo puede salir hacia afuera.  Puede sentir que le falta el aire debido a que se expande el tero.  Puede notar que el feto  "baja" o lo siente ms bajo, en el abdomen.  Puede tener una prdida de secrecin mucosa con sangre. Esto suele ocurrir en el trmino de unos pocos das a una semana antes de que comience el trabajo de parto.  El cuello del tero se vuelve delgado y blando (se borra) cerca de la fecha de parto. QU DEBE ESPERAR EN LOS EXMENES PRENATALES Le harn exmenes prenatales cada 2semanas hasta la semana36. A partir de ese momento le harn exmenes semanales. Durante una visita prenatal de rutina:  La pesarn para asegurarse de que usted y el feto estn creciendo normalmente.  Le tomarn la presin arterial.  Le medirn el abdomen para controlar el desarrollo del beb.  Se escucharn los latidos cardacos fetales.  Se evaluarn los resultados de los estudios solicitados en visitas anteriores.  Le revisarn el cuello del tero cuando est prxima la fecha de parto para controlar si este se ha borrado. Alrededor de la semana36, el mdico le revisar el cuello del tero. Al mismo tiempo, realizar un anlisis de las secreciones del tejido vaginal. Este examen es para determinar si hay un tipo de bacteria, estreptococo Grupo B. El mdico le explicar esto con ms detalle. El mdico puede preguntarle lo siguiente:  Cmo le gustara que fuera el parto.  Cmo se siente.  Si siente los movimientos del beb.  Si ha tenido sntomas anormales, como prdida de lquido, sangrado, dolores de cabeza intensos o clicos abdominales.  Si est consumiendo algn producto que contenga tabaco, como cigarrillos, tabaco de mascar y   cigarrillos electrnicos.  Si tiene alguna pregunta. Otros exmenes o estudios de deteccin que pueden realizarse durante el tercer trimestre incluyen lo siguiente:  Anlisis de sangre para controlar los niveles de hierro (anemia).  Controles fetales para determinar su salud, nivel de actividad y crecimiento. Si tiene alguna enfermedad o hay problemas durante el embarazo, le harn  estudios.  Prueba del VIH (virus de inmunodeficiencia humana). Si corre un riesgo alto, pueden realizarle una prueba de deteccin del VIH durante el tercer trimestre del embarazo. FALSO TRABAJO DE PARTO Es posible que sienta contracciones leves e irregulares que finalmente desaparecen. Se llaman contracciones de Braxton Hicks o falso trabajo de parto. Las contracciones pueden durar horas, das o incluso semanas, antes de que el verdadero trabajo de parto se inicie. Si las contracciones ocurren a intervalos regulares, se intensifican o se hacen dolorosas, lo mejor es que la revise el mdico. SIGNOS DE TRABAJO DE PARTO  Clicos de tipo menstrual.  Contracciones cada 5minutos o menos.  Contracciones que comienzan en la parte superior del tero y se extienden hacia abajo, a la zona inferior del abdomen y la espalda.  Sensacin de mayor presin en la pelvis o dolor de espalda.  Una secrecin de mucosidad acuosa o con sangre que sale de la vagina. Si tiene alguno de estos signos antes de la semana37 del embarazo, llame a su mdico de inmediato. Debe concurrir al hospital para que la controlen inmediatamente. INSTRUCCIONES PARA EL CUIDADO EN EL HOGAR  Evite fumar, consumir hierbas, beber alcohol y tomar frmacos que no le hayan recetado. Estas sustancias qumicas afectan la formacin y el desarrollo del beb.  No consuma ningn producto que contenga tabaco, lo que incluye cigarrillos, tabaco de mascar y cigarrillos electrnicos. Si necesita ayuda para dejar de fumar, consulte al mdico. Puede recibir asesoramiento y otro tipo de recursos para dejar de fumar.  Siga las indicaciones del mdico en relacin con el uso de medicamentos. Durante el embarazo, hay medicamentos que son seguros de tomar y otros que no.  Haga ejercicio solamente como se lo haya indicado el mdico. Sentir clicos uterinos es un buen signo para detener la actividad fsica.  Contine comiendo alimentos sanos con  regularidad.  Use un sostn que le brinde buen soporte si le duelen las mamas.  No se d baos de inmersin en agua caliente, baos turcos ni saunas.  Use el cinturn de seguridad en todo momento mientras conduce.  No coma carne cruda ni queso sin cocinar; evite el contacto con las bandejas sanitarias de los gatos y la tierra que estos animales usan. Estos elementos contienen grmenes que pueden causar defectos congnitos en el beb.  Tome las vitaminas prenatales.  Tome entre 1500 y 2000mg de calcio diariamente comenzando en la semana20 del embarazo hasta el parto.  Si est estreida, pruebe un laxante suave (si el mdico lo autoriza). Consuma ms alimentos ricos en fibra, como vegetales y frutas frescos y cereales integrales. Beba gran cantidad de lquido para mantener la orina de tono claro o color amarillo plido.  Dese baos de asiento con agua tibia para aliviar el dolor o las molestias causadas por las hemorroides. Use una crema para las hemorroides si el mdico la autoriza.  Si tiene venas varicosas, use medias de descanso. Eleve los pies durante 15minutos, 3 o 4veces por da. Limite el consumo de sal en su dieta.  Evite levantar objetos pesados, use zapatos de tacones bajos y mantenga una buena postura.  Descanse con las piernas elevadas si tiene   calambres o dolor de cintura.  Visite a su dentista si no lo ha hecho durante el embarazo. Use un cepillo de dientes blando para higienizarse los dientes y psese el hilo dental con suavidad.  Puede seguir manteniendo relaciones sexuales, a menos que el mdico le indique lo contrario.  No haga viajes largos excepto que sea absolutamente necesario y solo con la autorizacin del mdico.  Tome clases prenatales para entender, practicar y hacer preguntas sobre el trabajo de parto y el parto.  Haga un ensayo de la partida al hospital.  Prepare el bolso que llevar al hospital.  Prepare la habitacin del beb.  Concurra a todas  las visitas prenatales segn las indicaciones de su mdico.  SOLICITE ATENCIN MDICA SI:  No est segura de que est en trabajo de parto o de que ha roto la bolsa de las aguas.  Tiene mareos.  Siente clicos leves, presin en la pelvis o dolor persistente en el abdomen.  Tiene nuseas, vmitos o diarrea persistentes.  Observa una secrecin vaginal con mal olor.  Siente dolor al orinar.  SOLICITE ATENCIN MDICA DE INMEDIATO SI:  Tiene fiebre.  Tiene una prdida de lquido por la vagina.  Tiene sangrado o pequeas prdidas vaginales.  Siente dolor intenso o clicos en el abdomen.  Sube o baja de peso rpidamente.  Tiene dificultad para respirar y siente dolor de pecho.  Sbitamente se le hinchan mucho el rostro, las manos, los tobillos, los pies o las piernas.  No ha sentido los movimientos del beb durante una hora.  Siente un dolor de cabeza intenso que no se alivia con medicamentos.  Su visin se modifica.  Esta informacin no tiene como fin reemplazar el consejo del mdico. Asegrese de hacerle al mdico cualquier pregunta que tenga. Document Released: 10/28/2004 Document Revised: 02/08/2014 Document Reviewed: 03/21/2012 Elsevier Interactive Patient Education  2017 [employer]   Lactancia materna (Breastfeeding) Decidir amamantar es una de las mejores elecciones que puede hacer por usted y su beb. El cambio hormonal durante el embarazo produce el desarrollo del tejido mamario y aumenta la cantidad y el tamao de los conductos galactforos. Estas hormonas tambin permiten que las protenas, los azcares y las grasas de la sangre produzcan la leche materna en las glndulas productoras de leche. Las hormonas impiden que la leche materna sea liberada antes del nacimiento del beb, adems de impulsar el flujo de leche luego del nacimiento. Una vez que ha comenzado a amamantar, pensar en el beb, as como la succin o el llanto, pueden estimular la liberacin de leche  de las glndulas productoras de leche. LOS BENEFICIOS DE AMAMANTAR Para el beb  La primera leche (calostro) ayuda a mejorar el funcionamiento del sistema digestivo del beb.  La leche tiene anticuerpos que ayudan a prevenir las infecciones en el beb.  El beb tiene una menor incidencia de asma, alergias y del sndrome de muerte sbita del lactante.  Los nutrientes en la leche materna son mejores para el beb que la leche maternizada y estn preparados exclusivamente para cubrir las necesidades del beb.  La leche materna mejora el desarrollo cerebral del beb.  Es menos probable que el beb desarrolle otras enfermedades, como obesidad infantil, asma o diabetes mellitus de tipo 2. Para usted  La lactancia materna favorece el desarrollo de un vnculo muy especial entre la madre y el beb.  Es conveniente. La leche materna siempre est disponible a la temperatura correcta y es econmica.  La lactancia materna ayuda a quemar caloras y   a perder el peso ganado durante el embarazo.  Favorece la contraccin del tero al tamao que tena antes del embarazo de manera ms rpida y disminuye el sangrado (loquios) despus del parto.  La lactancia materna contribuye a reducir el riesgo de desarrollar diabetes mellitus de tipo 2, osteoporosis o cncer de mama o de ovario en el futuro. SIGNOS DE QUE EL BEB EST HAMBRIENTO Primeros signos de hambre  Aumenta su estado de alerta o actividad.  Se estira.  Mueve la cabeza de un lado a otro.  Mueve la cabeza y abre la boca cuando se le toca la mejilla o la comisura de la boca (reflejo de bsqueda).  Aumenta las vocalizaciones, tales como sonidos de succin, se relame los labios, emite arrullos, suspiros, o chirridos.  Mueve la mano hacia la boca.  Se chupa con ganas los dedos o las manos. Signos tardos de hambre  Est agitado.  Llora de manera intermitente. Signos de hambre extrema Los signos de hambre extrema requerirn que lo calme y  lo consuele antes de que el beb pueda alimentarse adecuadamente. No espere a que se manifiesten los siguientes signos de hambre extrema para comenzar a amamantar:  Agitacin.  Llanto intenso y fuerte.  Gritos. INFORMACIN BSICA SOBRE LA LACTANCIA MATERNA Iniciacin de la lactancia materna  Encuentre un lugar cmodo para sentarse o acostarse, con un buen respaldo para el cuello y la espalda.  Coloque una almohada o una manta enrollada debajo del beb para acomodarlo a la altura de la mama (si est sentada). Las almohadas para amamantar se han diseado especialmente a fin de servir de apoyo para los brazos y el beb mientras amamanta.  Asegrese de que el abdomen del beb est frente al suyo.  Masajee suavemente la mama. Con las yemas de los dedos, masajee la pared del pecho hacia el pezn en un movimiento circular. Esto estimula el flujo de leche. Es posible que deba continuar este movimiento mientras amamanta si la leche fluye lentamente.  Sostenga la mama con el pulgar por arriba del pezn y los otros 4 dedos por debajo de la mama. Asegrese de que los dedos se encuentren lejos del pezn y de la boca del beb.  Empuje suavemente los labios del beb con el pezn o con el dedo.  Cuando la boca del beb se abra lo suficiente, acrquelo rpidamente a la mama e introduzca todo el pezn y la zona oscura que lo rodea (areola), tanto como sea posible, dentro de la boca del beb. ? Debe haber ms areola visible por arriba del labio superior del beb que por debajo del labio inferior. ? La lengua del beb debe estar entre la enca inferior y la mama.  Asegrese de que la boca del beb est en la posicin correcta alrededor del pezn (prendida). Los labios del beb deben crear un sello sobre la mama y estar doblados hacia afuera (invertidos).  Es comn que el beb succione durante 2 a 3 minutos para que comience el flujo de leche materna. Cmo debe prenderse Es muy importante que le ensee al  beb cmo prenderse adecuadamente a la mama. Si el beb no se prende adecuadamente, puede causarle dolor en el pezn y reducir la produccin de leche materna, y hacer que el beb tenga un escaso aumento de peso. Adems, si el beb no se prende adecuadamente al pezn, puede tragar aire durante la alimentacin. Esto puede causarle molestias al beb. Hacer eructar al beb al cambiar de mama puede ayudarlo a liberar el   aire. Sin embargo, ensearle al beb cmo prenderse a la mama adecuadamente es la mejor manera de evitar que se sienta molesto por tragar aire mientras se alimenta. Signos de que el beb se ha prendido adecuadamente al pezn:  Tironea o succiona de modo silencioso, sin causarle dolor.  Se escucha que traga cada 3 o 4 succiones.  Hay movimientos musculares por arriba y por delante de sus odos al succionar. Signos de que el beb no se ha prendido adecuadamente al pezn:  Hace ruidos de succin o de chasquido mientras se alimenta.  Siente dolor en el pezn. Si cree que el beb no se prendi correctamente, deslice el dedo en la comisura de la boca y colquelo entre las encas del beb para interrumpir la succin. Intente comenzar a amamantar nuevamente. Signos de lactancia materna exitosa Signos del beb:  Disminuye gradualmente el nmero de succiones o cesa la succin por completo.  Se duerme.  Relaja el cuerpo.  Retiene una pequea cantidad de leche en la boca.  Se desprende solo del pecho. Signos que presenta usted:  Las mamas han aumentado la firmeza, el peso y el tamao 1 a 3 horas despus de amamantar.  Estn ms blandas inmediatamente despus de amamantar.  Un aumento del volumen de leche, y tambin un cambio en su consistencia y color se producen hacia el quinto da de lactancia materna.  Los pezones no duelen, ni estn agrietados ni sangran. Signos de que su beb recibe la cantidad de leche suficiente  Mojar por lo menos 1 o 2 paales durante las primeras 24  horas despus del nacimiento.  Mojar por lo menos 5 o 6 paales cada 24 horas durante la primera semana despus del nacimiento. La orina debe ser transparente o de color amarillo plido a los 5 das despus del nacimiento.  Mojar entre 6 y 8 paales cada 24 horas a medida que el beb sigue creciendo y desarrollndose.  Defeca al menos 3 veces en 24 horas a los 5 das de vida. La materia fecal debe ser blanda y amarillenta.  Defeca al menos 3 veces en 24 horas a los 7 das de vida. La materia fecal debe ser grumosa y amarillenta.  No registra una prdida de peso mayor del 10% del peso al nacer durante los primeros 3 das de vida.  Aumenta de peso un promedio de 4 a 7onzas (113 a 198g) por semana despus de los 4 das de vida.  Aumenta de peso, diariamente, de manera uniforme a partir de los 5 das de vida, sin registrar prdida de peso despus de las 2semanas de vida. Despus de alimentarse, es posible que el beb regurgite una pequea cantidad. Esto es frecuente. FRECUENCIA Y DURACIN DE LA LACTANCIA MATERNA El amamantamiento frecuente la ayudar a producir ms leche y a prevenir problemas de dolor en los pezones e hinchazn en las mamas. Alimente al beb cuando muestre signos de hambre o si siente la necesidad de reducir la congestin de las mamas. Esto se denomina "lactancia a demanda". Evite el uso del chupete mientras trabaja para establecer la lactancia (las primeras 4 a 6 semanas despus del nacimiento del beb). Despus de este perodo, podr ofrecerle un chupete. Las investigaciones demostraron que el uso del chupete durante el primer ao de vida del beb disminuye el riesgo de desarrollar el sndrome de muerte sbita del lactante (SMSL). Permita que el nio se alimente en cada mama todo lo que desee. Contine amamantando al beb hasta que haya terminado de alimentarse. Cuando   el beb se desprende o se queda dormido mientras se est alimentando de la primera mama, ofrzcale la segunda.  Debido a que, con frecuencia, los recin nacidos permanecen somnolientos las primeras semanas de vida, es posible que deba despertar al beb para alimentarlo. Los horarios de lactancia varan de un beb a otro. Sin embargo, las siguientes reglas pueden servir como gua para ayudarla a garantizar que el beb se alimenta adecuadamente:  Se puede amamantar a los recin nacidos (bebs de 4 semanas o menos de vida) cada 1 a 3 horas.  No deben transcurrir ms de 3 horas durante el da o 5 horas durante la noche sin que se amamante a los recin nacidos.  Debe amamantar al beb 8 veces como mnimo en un perodo de 24 horas, hasta que comience a introducir slidos en su dieta, a los 6 meses de vida aproximadamente. EXTRACCIN DE LECHE MATERNA La extraccin y el almacenamiento de la leche materna le permiten asegurarse de que el beb se alimente exclusivamente de leche materna, aun en momentos en los que no puede amamantar. Esto tiene especial importancia si debe regresar al trabajo en el perodo en que an est amamantando o si no puede estar presente en los momentos en que el beb debe alimentarse. Su asesor en lactancia puede orientarla sobre cunto tiempo es seguro almacenar leche materna. El sacaleche es un aparato que le permite extraer leche de la mama a un recipiente estril. Luego, la leche materna extrada puede almacenarse en un refrigerador o congelador. Algunos sacaleches son manuales, mientras que otros son elctricos. Consulte a su asesor en lactancia qu tipo ser ms conveniente para usted. Los sacaleches se pueden comprar; sin embargo, algunos hospitales y grupos de apoyo a la lactancia materna alquilan sacaleches mensualmente. Un asesor en lactancia puede ensearle cmo extraer leche materna manualmente, en caso de que prefiera no usar un sacaleche. CMO CUIDAR LAS MAMAS DURANTE LA LACTANCIA MATERNA Los pezones se secan, agrietan y duelen durante la lactancia materna. Las siguientes  recomendaciones pueden ayudarla a mantener las mamas humectadas y sanas:  Evite usar jabn en los pezones.  Use un sostn de soporte. Aunque no son esenciales, las camisetas sin mangas o los sostenes especiales para amamantar estn diseados para acceder fcilmente a las mamas, para amamantar sin tener que quitarse todo el sostn o la camiseta. Evite usar sostenes con aro o sostenes muy ajustados.  Seque al aire sus pezones durante 3 a 4minutos despus de amamantar al beb.  Utilice solo apsitos de algodn en el sostn para absorber las prdidas de leche. La prdida de un poco de leche materna entre las tomas es normal.  Utilice lanolina sobre los pezones luego de amamantar. La lanolina ayuda a mantener la humedad normal de la piel. Si usa lanolina pura, no tiene que lavarse los pezones antes de volver a alimentar al beb. La lanolina pura no es txica para el beb. Adems, puede extraer manualmente algunas gotas de leche materna y masajear suavemente esa leche sobre los pezones, para que la leche se seque al aire. Durante las primeras semanas despus de dar a luz, algunas mujeres pueden experimentar hinchazn en las mamas (congestin mamaria). La congestin puede hacer que sienta las mamas pesadas, calientes y sensibles al tacto. El pico de la congestin ocurre dentro de los 3 a 5 das despus del parto. Las siguientes recomendaciones pueden ayudarla a aliviar la congestin:  Vace por completo las mamas al amamantar o extraer leche. Puede aplicar calor hmedo en las mamas (  en la ducha o con toallas hmedas para manos) antes de amamantar o extraer leche. Esto aumenta la circulacin y ayuda a que la leche fluya. Si el beb no vaca por completo las mamas cuando lo amamanta, extraiga la leche restante despus de que haya finalizado.  Use un sostn ajustado (para amamantar o comn) o una camiseta sin mangas durante 1 o 2 das para indicar al cuerpo que disminuya ligeramente la produccin de  leche.  Aplique compresas de hielo sobre las mamas, a menos que le resulte demasiado incmodo.  Asegrese de que el beb est prendido y se encuentre en la posicin correcta mientras lo alimenta. Si la congestin persiste luego de 48 horas o despus de seguir estas recomendaciones, comunquese con su mdico o un asesor en lactancia. RECOMENDACIONES GENERALES PARA EL CUIDADO DE LA SALUD DURANTE LA LACTANCIA MATERNA  Consuma alimentos saludables. Alterne comidas y colaciones, y coma 3 de cada una por da. Dado que lo que come afecta la leche materna, es posible que algunas comidas hagan que su beb se vuelva ms irritable de lo habitual. Evite comer este tipo de alimentos si percibe que afectan de manera negativa al beb.  Beba leche, jugos de fruta y agua para satisfacer su sed (aproximadamente 10 vasos al da).  Descanse con frecuencia, reljese y tome sus vitaminas prenatales para evitar la fatiga, el estrs y la anemia.  Contine con los autocontroles de la mama.  Evite masticar y fumar tabaco. Las sustancias qumicas de los cigarrillos que pasan a la leche materna y la exposicin al humo ambiental del tabaco pueden daar al beb.  No consuma alcohol ni drogas, incluida la marihuana. Algunos medicamentos, que pueden ser perjudiciales para el beb, pueden pasar a travs de la leche materna. Es importante que consulte a su mdico antes de tomar cualquier medicamento, incluidos todos los medicamentos recetados y de venta libre, as como los suplementos vitamnicos y herbales. Puede quedar embarazada durante la lactancia. Si desea controlar la natalidad, consulte a su mdico cules son las opciones ms seguras para el beb. SOLICITE ATENCIN MDICA SI:  Usted siente que quiere dejar de amamantar o se siente frustrada con la lactancia.  Siente dolor en las mamas o en los pezones.  Sus pezones estn agrietados o sangran.  Sus pechos estn irritados, sensibles o calientes.  Tiene un rea  hinchada en cualquiera de las mamas.  Siente escalofros o fiebre.  Tiene nuseas o vmitos.  Presenta una secrecin de otro lquido distinto de la leche materna de los pezones.  Sus mamas no se llenan antes de amamantar al beb para el quinto da despus del parto.  Se siente triste y deprimida.  El beb est demasiado somnoliento como para comer bien.  El beb tiene problemas para dormir.  Moja menos de 3 paales en 24 horas.  Defeca menos de 3 veces en 24 horas.  La piel del beb o la parte blanca de los ojos se vuelven amarillentas.  El beb no ha aumentado de peso a los 5 das de vida.  SOLICITE ATENCIN MDICA DE INMEDIATO SI:  El beb est muy cansado (letargo) y no se quiere despertar para comer.  Le sube la fiebre sin causa.  Esta informacin no tiene como fin reemplazar el consejo del mdico. Asegrese de hacerle al mdico cualquier pregunta que tenga. Document Released: 01/18/2005 Document Revised: 05/12/2015 Document Reviewed: 07/12/2012 Elsevier Interactive Patient Education  2017 [employer]  

## 2016-05-19 ENCOUNTER — Telehealth (HOSPITAL_COMMUNITY): Payer: Self-pay | Admitting: *Deleted

## 2016-05-19 LAB — CERVICOVAGINAL ANCILLARY ONLY
Bacterial vaginitis: NEGATIVE
CANDIDA VAGINITIS: NEGATIVE
TRICH (WINDOWPATH): NEGATIVE

## 2016-05-19 NOTE — Telephone Encounter (Signed)
[id number] INTERPRETER NUMBER  PREADMISSION

## 2016-05-21 ENCOUNTER — Ambulatory Visit: Payer: Self-pay

## 2016-05-21 ENCOUNTER — Other Ambulatory Visit: Payer: Self-pay | Admitting: Obstetrics and Gynecology

## 2016-05-21 ENCOUNTER — Ambulatory Visit (INDEPENDENT_AMBULATORY_CARE_PROVIDER_SITE_OTHER): Payer: Medicaid Other | Admitting: *Deleted

## 2016-05-21 VITALS — BP 127/62 | HR 66

## 2016-05-21 DIAGNOSIS — O26613 Liver and biliary tract disorders in pregnancy, third trimester: Secondary | ICD-10-CM

## 2016-05-21 DIAGNOSIS — K831 Obstruction of bile duct: Secondary | ICD-10-CM

## 2016-05-21 DIAGNOSIS — O480 Post-term pregnancy: Secondary | ICD-10-CM

## 2016-05-21 NOTE — Progress Notes (Signed)
Pt informed that the ultrasound is considered a limited OB ultrasound and is not intended to be a complete ultrasound exam.  Patient also informed that the ultrasound is not being completed with the intent of assessing for fetal or placental anomalies or any pelvic abnormalities.  Explained that the purpose of today's ultrasound is to assess for presentation and amniotic fluid volume.  Patient acknowledges the purpose of the exam and the limitations of the study.    IOL scheduled 4/24. Transverse presentation per [place] today - may need version prior to start of IOL on 4/24.  [facility] Suites notified and notes entered to IOL appt.

## 2016-05-21 NOTE — Progress Notes (Signed)
NST reviewed and reactive.  

## 2016-05-24 ENCOUNTER — Encounter (HOSPITAL_COMMUNITY): Payer: Self-pay

## 2016-05-24 ENCOUNTER — Inpatient Hospital Stay (HOSPITAL_COMMUNITY)
Admission: AD | Admit: 2016-05-24 | Discharge: 2016-05-26 | DRG: 775 | Disposition: A | Discharge status: Home / Self Care | Payer: Medicaid Other | Source: Ambulatory Visit | Attending: Obstetrics & Gynecology | Admitting: Obstetrics & Gynecology

## 2016-05-24 DIAGNOSIS — O480 Post-term pregnancy: Secondary | ICD-10-CM | POA: Diagnosis present

## 2016-05-24 DIAGNOSIS — K219 Gastro-esophageal reflux disease without esophagitis: Secondary | ICD-10-CM | POA: Diagnosis present

## 2016-05-24 DIAGNOSIS — O9962 Diseases of the digestive system complicating childbirth: Secondary | ICD-10-CM | POA: Diagnosis present

## 2016-05-24 DIAGNOSIS — O4202 Full-term premature rupture of membranes, onset of labor within 24 hours of rupture: Secondary | ICD-10-CM | POA: Diagnosis present

## 2016-05-24 DIAGNOSIS — O99824 Streptococcus B carrier state complicating childbirth: Secondary | ICD-10-CM | POA: Diagnosis present

## 2016-05-24 DIAGNOSIS — Z3A40 40 weeks gestation of pregnancy: Secondary | ICD-10-CM | POA: Diagnosis not present

## 2016-05-24 DIAGNOSIS — R8271 Bacteriuria: Secondary | ICD-10-CM

## 2016-05-24 LAB — RPR: RPR: NONREACTIVE

## 2016-05-24 LAB — CBC
HEMATOCRIT: 37.6 % (ref 36.0–46.0)
HEMOGLOBIN: 13 g/dL (ref 12.0–15.0)
MCH: 29.5 pg (ref 26.0–34.0)
MCHC: 34.6 g/dL (ref 30.0–36.0)
MCV: 85.3 fL (ref 78.0–100.0)
Platelets: 223 10*3/uL (ref 150–400)
RBC: 4.41 MIL/uL (ref 3.87–5.11)
RDW: 14.6 % (ref 11.5–15.5)
WBC: 8.4 10*3/uL (ref 4.0–10.5)

## 2016-05-24 LAB — ABO/RH: ABO/RH(D): O POS

## 2016-05-24 LAB — POCT FERN TEST: POCT Fern Test: POSITIVE

## 2016-05-24 LAB — TYPE AND SCREEN
ABO/RH(D): O POS
Antibody Screen: NEGATIVE

## 2016-05-24 MED ORDER — OXYTOCIN BOLUS FROM INFUSION
500.0000 mL | Freq: Once | INTRAVENOUS | Status: AC
  Administered 2016-05-24: 500 mL via INTRAVENOUS

## 2016-05-24 MED ORDER — HYDROXYZINE HCL 50 MG PO TABS
50.0000 mg | ORAL_TABLET | Freq: Four times a day (QID) | ORAL | Status: DC | PRN
  Filled 2016-05-24: qty 1

## 2016-05-24 MED ORDER — OXYTOCIN 40 UNITS IN LACTATED RINGERS INFUSION - SIMPLE MED
2.5000 [IU]/h | INTRAVENOUS | Status: DC
  Filled 2016-05-24: qty 1000

## 2016-05-24 MED ORDER — FENTANYL CITRATE (PF) 100 MCG/2ML IJ SOLN: 50.0000 ug | INTRAMUSCULAR | Status: DC | PRN

## 2016-05-24 MED ORDER — SODIUM CHLORIDE 0.9 % IV SOLN: 250.0000 mL | INTRAVENOUS | Status: DC | PRN

## 2016-05-24 MED ORDER — VANCOMYCIN HCL IN DEXTROSE 1-5 GM/200ML-% IV SOLN
1000.0000 mg | Freq: Two times a day (BID) | INTRAVENOUS | Status: DC
  Administered 2016-05-24 (×2): 1000 mg via INTRAVENOUS
  Filled 2016-05-24 (×3): qty 200

## 2016-05-24 MED ORDER — OXYTOCIN 10 UNIT/ML IJ SOLN: 10.0000 [IU] | Freq: Once | INTRAMUSCULAR | Status: DC

## 2016-05-24 MED ORDER — CEPHALEXIN 500 MG PO CAPS
500.0000 mg | ORAL_CAPSULE | Freq: Three times a day (TID) | ORAL | Status: AC
  Administered 2016-05-24 – 2016-05-25 (×3): 500 mg via ORAL

## 2016-05-24 MED ORDER — SOD CITRATE-CITRIC ACID 500-334 MG/5ML PO SOLN: 30.0000 mL | ORAL | Status: DC | PRN

## 2016-05-24 MED ORDER — DIPHENHYDRAMINE HCL 50 MG/ML IJ SOLN
INTRAMUSCULAR | Status: AC
  Filled 2016-05-24: qty 1

## 2016-05-24 MED ORDER — CARBOPROST TROMETHAMINE 250 MCG/ML IM SOLN
250.0000 ug | Freq: Once | INTRAMUSCULAR | Status: AC
  Administered 2016-05-24: 250 ug via INTRAMUSCULAR

## 2016-05-24 MED ORDER — OXYCODONE-ACETAMINOPHEN 5-325 MG PO TABS: 2.0000 | ORAL_TABLET | ORAL | Status: DC | PRN

## 2016-05-24 MED ORDER — DIPHENOXYLATE-ATROPINE 2.5-0.025 MG PO TABS
1.0000 | ORAL_TABLET | Freq: Four times a day (QID) | ORAL | Status: DC | PRN
  Administered 2016-05-24: 1 via ORAL
  Filled 2016-05-24: qty 1

## 2016-05-24 MED ORDER — ACETAMINOPHEN 325 MG PO TABS: 650.0000 mg | ORAL_TABLET | ORAL | Status: DC | PRN

## 2016-05-24 MED ORDER — LIDOCAINE HCL (PF) 1 % IJ SOLN
30.0000 mL | INTRAMUSCULAR | Status: DC | PRN
  Filled 2016-05-24: qty 30

## 2016-05-24 MED ORDER — LACTATED RINGERS IV SOLN
500.0000 mL | INTRAVENOUS | Status: DC | PRN
  Administered 2016-05-24: 500 mL via INTRAVENOUS

## 2016-05-24 MED ORDER — SODIUM CHLORIDE 0.9% FLUSH: 3.0000 mL | INTRAVENOUS | Status: DC | PRN

## 2016-05-24 MED ORDER — LACTATED RINGERS IV SOLN
INTRAVENOUS | Status: DC
  Administered 2016-05-24 (×3): via INTRAVENOUS

## 2016-05-24 MED ORDER — ONDANSETRON HCL 4 MG/2ML IJ SOLN
4.0000 mg | Freq: Four times a day (QID) | INTRAMUSCULAR | Status: DC | PRN
  Administered 2016-05-24: 4 mg via INTRAVENOUS
  Filled 2016-05-24: qty 2

## 2016-05-24 MED ORDER — MISOPROSTOL 200 MCG PO TABS
800.0000 ug | ORAL_TABLET | Freq: Once | ORAL | Status: AC
  Administered 2016-05-24: 800 ug via RECTAL

## 2016-05-24 MED ORDER — OXYCODONE-ACETAMINOPHEN 5-325 MG PO TABS
1.0000 | ORAL_TABLET | ORAL | Status: DC | PRN
  Administered 2016-05-25: 1 via ORAL
  Filled 2016-05-24: qty 1

## 2016-05-24 MED ORDER — FENTANYL CITRATE (PF) 100 MCG/2ML IJ SOLN
100.0000 ug | INTRAMUSCULAR | Status: DC | PRN
  Administered 2016-05-24: 100 ug via INTRAVENOUS
  Filled 2016-05-24: qty 2

## 2016-05-24 MED ORDER — OXYTOCIN 40 UNITS IN LACTATED RINGERS INFUSION - SIMPLE MED
1.0000 m[IU]/min | INTRAVENOUS | Status: DC
  Administered 2016-05-24: 2 m[IU]/min via INTRAVENOUS

## 2016-05-24 MED ORDER — SODIUM CHLORIDE 0.9% FLUSH: 3.0000 mL | Freq: Two times a day (BID) | INTRAVENOUS | Status: DC

## 2016-05-24 MED ORDER — FENTANYL CITRATE (PF) 100 MCG/2ML IJ SOLN: 100.0000 ug | INTRAMUSCULAR | Status: DC | PRN

## 2016-05-24 MED ORDER — DIPHENHYDRAMINE HCL 50 MG/ML IJ SOLN
25.0000 mg | Freq: Once | INTRAMUSCULAR | Status: AC
  Administered 2016-05-24: 25 mg via INTRAVENOUS

## 2016-05-24 MED ORDER — TERBUTALINE SULFATE 1 MG/ML IJ SOLN
0.2500 mg | Freq: Once | INTRAMUSCULAR | Status: DC | PRN
  Filled 2016-05-24: qty 1

## 2016-05-24 MED ORDER — MISOPROSTOL 200 MCG PO TABS
ORAL_TABLET | ORAL | Status: AC
  Filled 2016-05-24: qty 4

## 2016-05-24 NOTE — Anesthesia Pain Management Evaluation Note (Signed)
  CRNA Pain Management Visit Note  Patient: Beth Brown, 36 y.o., female  "Hello I am a member of the anesthesia team at [facility]. We have an anesthesia team available at all times to provide care throughout the hospital, including epidural management and anesthesia for C-section. I don't know your plan for the delivery whether it a natural birth, water birth, IV sedation, nitrous supplementation, doula or epidural, but we want to meet your pain goals."   1.Was your pain managed to your expectations on prior hospitalizations?   Yes   2.What is your expectation for pain management during this hospitalization?     Labor support without medications and IV pain meds  3.How can we help you reach that goal? As natural as possible. Spanish interpreter and Brown&D RN at bedside. All questions answered.  Record the patient's initial score and the patient's pain goal.   Pain: 6  Pain Goal: 6 [facility] wants you to be able to say your pain was always managed very well.  Beth Brown 05/24/2016

## 2016-05-24 NOTE — MAU Note (Signed)
Pt c/o leaking that started this morning at 0600. Pt denies contractions and bleeding. Pt states baby is moving normally.

## 2016-05-24 NOTE — Progress Notes (Signed)
Labor Progress Note  S: Patient feeling a lot of pressure and wanting to push   O:  BP (!) 140/59   Pulse 68   Temp 97.8 F (36.6 C) (Oral)   Resp 18   Ht  (1.575 m)   Wt 87.5 kg (193 lb)   LMP 08/12/2015 (Exact Date)   SpO2 98%   BMI 35.30 kg/m  EFM: 145, moderate variability, +accels, early decel WUJ:WJXBJYNW: 9 Effacement (%): 100 Cervical Position: Posterior, Middle Station: -1 Presentation: Vertex Exam by:: Dr. Redmond Baseman   A&P: 36 y.o. [id number] [redacted]w[redacted]d SOL #Labor:augmenting with pitocin. Fetus OP. Place on peanut ball in left lateral recumbent, forebag present and ruptured #FWB: Category1 tracing #GBS: positive-vanc   Durenda Hurt, MD Resident Physician 6:16 PM

## 2016-05-24 NOTE — Progress Notes (Signed)
Labor Progress Note  S: Discussed with patient that contractions have spaced out. She is still feeling contractions and they are strong.  O:  BP 132/75   Pulse 70   Temp 98.4 F (36.9 C) (Oral)   Resp 18   Ht  (1.575 m)   Wt 87.5 kg (193 lb)   LMP 08/12/2015 (Exact Date)   SpO2 98%   BMI 35.30 kg/m  EFM: 135, mod var, +accels, no decels GNF:AOZHYQMV: 4 Effacement (%): 70 Cervical Position: Middle Station: -3 Presentation: Vertex Exam by:: Dr. Hillis Range   A&P: 36 y.o. G4P3003 [redacted]w[redacted]d SROM, SOL #Labor:augment with pitocin #FWB: Category 1 tracing #GBS: positive, tx with vanc (clinda resistant and anaphlaxis w/ PCN), had redman, got benadryl and slowed rate of infusion  Durenda Hurt, MD Resident Physician 11:34 AM

## 2016-05-24 NOTE — H&P (Signed)
Beth Brown is a 36 y.o. female [phone] presenting for SROM @ 0600 and contractions. GBS pos OB History    Gravida Para Term Preterm AB Living   0 3   SAB TAB Ectopic Multiple Live Births           3     Past Medical History:  Diagnosis Date  . Eczema   . GERD (gastroesophageal reflux disease)    Past Surgical History:  Procedure Laterality Date  . NO PAST SURGERIES     Family History: family history includes Cancer in her maternal aunt. Social History:  reports that she has never smoked. She has never used smokeless tobacco. She reports that she does not drink alcohol or use drugs.     Maternal Diabetes: No Genetic Screening: Normal Maternal Ultrasounds/Referrals: Normal Fetal Ultrasounds or other Referrals:  None Maternal Substance Abuse:  No Significant Maternal Medications:  None Significant Maternal Lab Results:  Lab values include: Group B Strep positive Other Comments:  None  ROS Maternal Medical History:  Reason for admission: Rupture of membranes.   Contractions: Onset was 3-5 hours ago.   Frequency: regular.   Perceived severity is mild.    Fetal activity: Perceived fetal activity is normal.   Last perceived fetal movement was within the past hour.    Prenatal complications: no prenatal complications Prenatal Complications - Diabetes: none.    Dilation: 1.5 Effacement (%): 60 Station: -3 Exam by:: christy white,rnc Blood pressure 139/70, pulse 66, temperature 98.2 F (36.8 C), temperature source Oral, resp. rate 18, height  (1.549 m), weight 183 lb 4 oz (83.1 kg), last menstrual period 08/12/2015, SpO2 100 %. Maternal Exam:  Uterine Assessment: Contraction strength is mild.  Contraction frequency is regular.   Abdomen: Patient reports no abdominal tenderness. Estimated fetal weight is 8.5lbs.   Fetal presentation: vertex  Introitus: Normal vulva. Normal vagina.  Pelvis: adequate for delivery.   Cervix: Cervix evaluated by  digital exam.     Fetal Exam Fetal Monitor Review: Mode: ultrasound.   Variability: moderate (6-25 bpm).   Pattern: accelerations present.    Fetal State Assessment: Category I - tracings are normal.     Physical Exam  Constitutional: She is oriented to person, place, and time. She appears well-developed and well-nourished.  HENT:  Head: Normocephalic.  Eyes: Pupils are equal, round, and reactive to light.  Neck: Normal range of motion.  Cardiovascular: Normal rate, regular rhythm, normal heart sounds and intact distal pulses.   Respiratory: Effort normal and breath sounds normal.  GI: Soft. Bowel sounds are normal.  Genitourinary: Vagina normal and uterus normal.  Musculoskeletal: Normal range of motion.  Neurological: She is alert and oriented to person, place, and time. She has normal reflexes.  Skin: Skin is warm and dry.  Psychiatric: She has a normal mood and affect. Her behavior is normal. Judgment and thought content normal.    Prenatal labs: ABO, Rh: O/POS/-- (09/19 1056) Antibody: NEG (09/19 1056) Rubella: 4.22 (09/19 1056) RPR: NON REAC (01/30 1543)  HBsAg: NEGATIVE (09/19 1056)  HIV: NONREACTIVE (01/30 1543)  GBS: Positive (01/11 0000)   Assessment/Plan: SROM @ 40.6 wks vertex presentation confirmed by u/s GBS pos SVE 2/60/-3 Plan: admit  GBS prophalaxis   Wyvonnia Dusky 05/24/2016, 7:54 AM

## 2016-05-24 NOTE — Progress Notes (Signed)
Called to room to assess bleeding, Cytotec already given Fundus boggy and several large clots expressed. Fundal massage performed and uterus firmed up LUS explored and no retained products detected Moderate bleeding with clots continue Hemabate ordered- mild hypertension noted VSS EBL 600 mL total including delivery

## 2016-05-24 NOTE — Consult Note (Signed)
Neonatology Note:  Attendance at Code Apgar:   Our team responded to a Code Apgar call to room # 166 following NSVD complicated by shoulder dystocia. The requesting physician was Dr. Arnold. The mother is a G4P3 O pos, GBS positive with post-dates pregnancy (40 6/7 weeks). ROM occurred 16  hours PTD and the fluid was clear. She was treated with Keflex and Vancomycin > 4 hours prior to delivery and was afebrile throughout.  At delivery, the baby did well. Our team arrived at 1 minute of life, at which time the baby was crying, with normal HR, tone, and color.  Ap 9/9.  I spoke with the parents in the DR in Spanish, then transferred the baby to the Pediatrician's care.   Beth Donnellan C. Lonell Stamos, MD 

## 2016-05-25 ENCOUNTER — Inpatient Hospital Stay (HOSPITAL_COMMUNITY)
Admission: RE | Admit: 2016-05-25 | Discharge: 2016-05-25 | Disposition: A | Discharge status: Home / Self Care | Payer: Medicaid Other | Source: Ambulatory Visit | Attending: Family Medicine | Admitting: Family Medicine

## 2016-05-25 DIAGNOSIS — O99824 Streptococcus B carrier state complicating childbirth: Secondary | ICD-10-CM

## 2016-05-25 DIAGNOSIS — Z3A40 40 weeks gestation of pregnancy: Secondary | ICD-10-CM

## 2016-05-25 LAB — CBC
HEMATOCRIT: 36.9 % (ref 36.0–46.0)
HEMOGLOBIN: 12.7 g/dL (ref 12.0–15.0)
MCH: 29.7 pg (ref 26.0–34.0)
MCHC: 34.4 g/dL (ref 30.0–36.0)
MCV: 86.2 fL (ref 78.0–100.0)
Platelets: 231 10*3/uL (ref 150–400)
RBC: 4.28 MIL/uL (ref 3.87–5.11)
RDW: 15 % (ref 11.5–15.5)
WBC: 18.8 10*3/uL — ABNORMAL HIGH (ref 4.0–10.5)

## 2016-05-25 MED ORDER — ONDANSETRON HCL 4 MG/2ML IJ SOLN: 4.0000 mg | INTRAMUSCULAR | Status: DC | PRN

## 2016-05-25 MED ORDER — ONDANSETRON HCL 4 MG PO TABS: 4.0000 mg | ORAL_TABLET | ORAL | Status: DC | PRN

## 2016-05-25 MED ORDER — SIMETHICONE 80 MG PO CHEW: 80.0000 mg | CHEWABLE_TABLET | ORAL | Status: DC | PRN

## 2016-05-25 MED ORDER — WITCH HAZEL-GLYCERIN EX PADS: 1.0000 "application " | MEDICATED_PAD | CUTANEOUS | Status: DC | PRN

## 2016-05-25 MED ORDER — COCONUT OIL OIL
1.0000 "application " | TOPICAL_OIL | Status: DC | PRN
  Administered 2016-05-25: 1 via TOPICAL
  Filled 2016-05-25: qty 120

## 2016-05-25 MED ORDER — ACETAMINOPHEN 325 MG PO TABS: 650.0000 mg | ORAL_TABLET | ORAL | Status: DC | PRN

## 2016-05-25 MED ORDER — DIPHENHYDRAMINE HCL 25 MG PO CAPS: 25.0000 mg | ORAL_CAPSULE | Freq: Four times a day (QID) | ORAL | Status: DC | PRN

## 2016-05-25 MED ORDER — ZOLPIDEM TARTRATE 5 MG PO TABS: 5.0000 mg | ORAL_TABLET | Freq: Every evening | ORAL | Status: DC | PRN

## 2016-05-25 MED ORDER — DIBUCAINE 1 % RE OINT: 1.0000 "application " | TOPICAL_OINTMENT | RECTAL | Status: DC | PRN

## 2016-05-25 MED ORDER — TETANUS-DIPHTH-ACELL PERTUSSIS 5-2.5-18.5 LF-MCG/0.5 IM SUSP: 0.5000 mL | Freq: Once | INTRAMUSCULAR | Status: DC

## 2016-05-25 MED ORDER — PRENATAL MULTIVITAMIN CH
1.0000 | ORAL_TABLET | Freq: Every day | ORAL | Status: DC
  Administered 2016-05-25 – 2016-05-26 (×2): 1 via ORAL
  Filled 2016-05-25 (×2): qty 1

## 2016-05-25 MED ORDER — SENNOSIDES-DOCUSATE SODIUM 8.6-50 MG PO TABS
2.0000 | ORAL_TABLET | ORAL | Status: DC
  Administered 2016-05-26: 2 via ORAL
  Filled 2016-05-25: qty 2

## 2016-05-25 MED ORDER — BENZOCAINE-MENTHOL 20-0.5 % EX AERO: 1.0000 "application " | INHALATION_SPRAY | CUTANEOUS | Status: DC | PRN

## 2016-05-25 MED ORDER — IBUPROFEN 600 MG PO TABS
600.0000 mg | ORAL_TABLET | Freq: Four times a day (QID) | ORAL | Status: DC
  Administered 2016-05-25 – 2016-05-26 (×6): 600 mg via ORAL
  Filled 2016-05-25 (×6): qty 1

## 2016-05-25 NOTE — Lactation Note (Signed)
This note was copied from a baby's chart. Lactation Consultation Note  Baby 14 hours old.  P3, Ex BF for 1 year. Mother states she knows how to hand express and baby cluster fed last night. Discussed waking baby for feedings to have longer feeds. Mom encouraged to feed baby 8-12 times/24 hours and with feeding cues.  Mom made aware of O/P services, breastfeeding support groups, community resources, and our phone # for post-discharge questions.     Patient Name: Girl Kellin Fifer [id number] Date: 05/25/2016 Reason for consult: Initial assessment   Maternal Data Has patient been taught Hand Expression?: Yes Does the patient have breastfeeding experience prior to this delivery?: Yes  Feeding Feeding Type: Breast Fed Length of feed: 10 min  LATCH Score/Interventions                      Lactation Tools Discussed/Used     Consult Status Consult Status: Follow-up Date: 05/26/16 Follow-up type: In-patient    Dahlia Byes [facility] 05/25/2016, 12:23 PM

## 2016-05-25 NOTE — Progress Notes (Signed)
Post Partum Day 1 Subjective: no complaints, up ad lib, voiding and tolerating PO Some itching from PUPS  Objective: Blood pressure 131/62, pulse (!) 58, temperature 98.6 F (37 C), temperature source Oral, resp. rate 18, height  (1.575 m), weight 87.5 kg (193 lb), last menstrual period 08/12/2015, SpO2 98 %, unknown if currently breastfeeding.  Physical Exam:  General: alert, cooperative and no distress Lochia: appropriate Uterine Fundus: firm DVT Evaluation: No evidence of DVT seen on physical exam. Negative Homan's sign. No cords or calf tenderness. No significant calf/ankle edema.   Recent Labs  05/24/16 0804  HGB 13.0  HCT 37.6    Assessment/Plan: Plan for discharge tomorrow   LOS: 1 day   Donette Larry, CNM 05/25/2016, 7:41 AM

## 2016-05-26 DIAGNOSIS — Z3A40 40 weeks gestation of pregnancy: Secondary | ICD-10-CM

## 2016-05-26 DIAGNOSIS — O99824 Streptococcus B carrier state complicating childbirth: Secondary | ICD-10-CM

## 2016-05-26 MED ORDER — IBUPROFEN 600 MG PO TABS: 600.0000 mg | ORAL_TABLET | Freq: Four times a day (QID) | ORAL | 0 refills | Status: DC

## 2016-05-26 MED ORDER — OXYCODONE-ACETAMINOPHEN 5-325 MG PO TABS: 1.0000 | ORAL_TABLET | Freq: Four times a day (QID) | ORAL | 0 refills | Status: DC | PRN

## 2016-05-26 MED ORDER — POLYETHYLENE GLYCOL 3350 17 G PO PACK: 17.0000 g | PACK | Freq: Every day | ORAL | 0 refills | Status: DC | PRN

## 2016-05-26 MED FILL — OXYCODONE-ACETAMINOPHEN 5-3: 5-325 | 5 days supply | Qty: 20 | Fill #0

## 2016-05-26 MED FILL — IBUPROFEN 600 MG TABLET: 600 | 7 days supply | Qty: 30 | Fill #0

## 2016-05-26 NOTE — Discharge Summary (Signed)
OB Discharge Summary     Patient Name: Beth Brown DOB: September 17, 1980 MRN: [id number]  Date of admission: 05/24/2016 Delivering MD: Durenda Hurt   Date of discharge: 05/26/2016  Admitting diagnosis: 41 WEEKS LEAKING FLUID Intrauterine pregnancy: [redacted]w[redacted]d     Secondary diagnosis:  Active Problems:   Normal labor  Additional problems: 1.5 min shoulder dystocia     Discharge diagnosis: Term Pregnancy Delivered                                                                                                Post partum procedures:None  Augmentation: Pitocin  Complications: Shoulder dystocia  Hospital course:  Onset of Labor With Vaginal Delivery     36 y.o. yo [id number] at [redacted]w[redacted]d was admitted in Active Labor on 05/24/2016. Patient had an uncomplicated labor course as follows:  Membrane Rupture Time/Date: 6:00 AM ,05/24/2016   Intrapartum Procedures: Episiotomy: None [1]                                         Lacerations:  None [1]  Patient had a delivery of a Viable infant. 05/24/2016  Information for the patient's newborn:  Robyn Haber, Girl Laylonie [[id number]]  Delivery Method: Vaginal, Spontaneous Delivery (Filed from Delivery Summary)    Pateint had an uncomplicated postpartum course.  She is ambulating, tolerating a regular diet, passing flatus, and urinating well. Patient is discharged home in stable condition on 05/26/16.   Physical exam  Vitals:   05/25/16 0553 05/25/16 1400 05/25/16 1924 05/26/16 0508  BP: 131/62 128/74 115/61 (!) 110/57  Pulse: (!) 58 62 65 66  Resp: Temp: 98.6 F (37 C) 98.5 F (36.9 C) 98 F (36.7 C) 98.4 F (36.9 C)  TempSrc: Oral Oral Oral   SpO2:  99% 99%   Weight:      Height:       General: alert, cooperative and no distress Lochia: appropriate Uterine Fundus: firm, at level of umbilicus Incision: N/A DVT Evaluation: No evidence of DVT seen on physical exam. Labs: Lab Results  Component Value Date   WBC 18.8  (H) 05/25/2016   HGB 12.7 05/25/2016   HCT 36.9 05/25/2016   MCV 86.2 05/25/2016   PLT 231 05/25/2016   CMP Latest Ref Rng & Units 03/02/2016  Glucose 65 - 99 mg/dL 85  BUN 7 - 25 mg/dL 8  Creatinine 3.29 - 5.18 mg/dL 8.41(Y)  Sodium 606 - 301 mmol/L 137  Potassium 3.5 - 5.3 mmol/L 4.0  Chloride 98 - 110 mmol/L 103  CO2 20 - 31 mmol/L 23  Calcium 8.6 - 10.2 mg/dL 9.6  Total Protein 6.1 - 8.1 g/dL 6.7  Total Bilirubin 0.2 - 1.2 mg/dL 0.3  Alkaline Phos 33 - 115 U/L 81  AST 10 - 30 U/L 16  ALT 6 - 29 U/L 12    Discharge instruction: per After Visit Summary and "Baby and Me Booklet".  After visit meds:  Allergies as  of 05/26/2016      Reactions   Penicillins Hives   Has patient had a PCN reaction causing immediate rash, facial/tongue/throat swelling, SOB or lightheadedness with hypotension: Yes Has patient had a PCN reaction causing severe rash involving mucus membranes or skin necrosis: Yes Has patient had a PCN reaction that required hospitalization No Has patient had a PCN reaction occurring within the last 10 years: No If all of the above answers are "NO", then may proceed with Cephalosporin use.      Medication List    STOP taking these medications   cephALEXin 500 MG capsule Commonly known as:  KEFLEX   triamcinolone ointment 0.1 % Commonly known as:  KENALOG     TAKE these medications   ibuprofen 600 MG tablet Commonly known as:  ADVIL,MOTRIN Take 1 tablet (600 mg total) by mouth every 6 (six) hours.   oxyCODONE-acetaminophen 5-325 MG tablet Commonly known as:  ROXICET Take 1 tablet by mouth every 6 (six) hours as needed for severe pain.   polyethylene glycol packet Commonly known as:  MIRALAX / GLYCOLAX Take 17 g by mouth daily as needed for moderate constipation.   Prenatal Vitamin 27-0.8 MG Tabs Take 1 tablet by mouth daily.       Diet: routine diet  Activity: Advance as tolerated. Pelvic rest for 6 weeks.   Outpatient follow up:6 weeks Follow  up Appt:Future Appointments Date Time Provider Department Center  07/06/2016 10:00 AM Dorathy Kinsman, CNM [facility]   Follow up Visit:No Follow-up on file.  Postpartum contraception: IUD Paragard  Newborn Data: Live born female  Birth Weight: 8 lb 14.2 oz (4030 g) APGAR: 9, 9  Baby Feeding: Breast Disposition:home with mother   05/26/2016 Durenda Hurt, MD   I confirm that I have verified the information documented in the resident's note and that I have also personally reperformed the physical exam and all medical decision making activities. Tawnya Crook  9:47 AM 05/26/16

## 2016-05-26 NOTE — Discharge Instructions (Signed)
Informacin sobre el dispositivo intrauterino (Intrauterine Device Information) Un dispositivo intrauterino (DIU) se inserta en el tero e impide el embarazo. Hay dos tipos de DIU:  DIU de cobre: este tipo de DIU est recubierto con un alambre de cobre y se inserta dentro del tero. El cobre hace que el tero y las trompas de Falopio produzcan un liquido que [employer] espermatozoides. El DIU de cobre puede Geneticist, molecular durante 10 aos.  DIU con hormona: este tipo de DIU contiene la hormona progestina (progesterona sinttica). Las hormonas hacen que el moco cervical se haga ms espeso, lo que evita que el esperma ingrese al tero. Tambin hace que la membrana que recubre internamente al tero sea ms delgada lo que impide el implante del vulo fertilizado. La hormona debilita o destruye los espermatozoides que ingresan al tero. Alguno de los tipos de DIU hormonal pueden Geneticist, molecular durante 5 aos y otros tipos pueden dejarse en el lugar por 3 aos. El mdico se asegurar de que usted sea una buena candidata para usar el DIU. Converse con su mdico acerca de los posibles efectos secundarios. VENTAJASDEL DISPOSITIVO INTRAUTERINO  El DIU es muy eficaz, reversible, de accin prolongada y de bajo mantenimiento.  No hay efectos secundarios relacionados con el estrgeno.  El DIU puede ser utilizado durante la Market researcher.  No est asociado con el aumento de [place].  Funciona inmediatamente despus de la insercin.  El DIU hormonal funciona inmediatamente si se inserta dentro de los [address] del inicio del perodo. Ser necesario que utilice un mtodo anticonceptivo adicional durante 7 das si el DIU hormonal se inserta en algn otro momento del ciclo.  El DIU de cobre no interfiere con las hormonas femeninas.  El DIU hormonal puede hacer que los perodos menstruales abundantes se hagan ms ligeros y que haya menos clicos.  El DIU hormonal puede usarse durante 3 a 5 aos.  El  DIU de cobre puede usarse durante 10 aos. DESVENTAJASDEL DISPOSITIVO [employer] DIU hormonal puede estar asociado con patrones de sangrado irregular.  El DIU de cobre puede hacer que el flujo menstrual ms abundante y doloroso.  Puede experimentar clicos y sangrado vaginal despus de la insercin. Esta informacin no tiene Theme park manager el consejo del mdico. Asegrese de hacerle al mdico cualquier pregunta que tenga. Document Released: 07/08/2009 Document Revised: 05/12/2015 Document Reviewed: 07/09/2012 Elsevier Interactive Patient Education  2017 [employer]. Instrucciones para la mam sobre los cuidados en el hogar (Home Care Instructions for Mom) ACTIVIDAD  Reanude sus actividades regulares de forma gradual.  Descanse. Tome siestas cuando el beb duerme.  No levante objetos que pesen ms de 10libras (4,5kg) hasta que el mdico se lo autorice.  Evite las actividades que demandan mucho esfuerzo y Engineer, drilling (que son extenuantes) hasta que el mdico se lo autorice. Caminar a un ritmo tranquilo a moderado siempre es ms seguro.  Si tuvo un parto por cesrea:  No pase la aspiradora, suba escaleras o conduzca un vehculo durante 4 o 6 semanas.  Pdale a alguien que le brinde ayuda con las tareas [employer] que pueda realizarlas por su cuenta.  Haga ejercicios como se lo haya indicado el mdico, si corresponde. HEMORRAGIA VAGINAL Probablemente contine sangrando durante 4 o 6 semanas despus del parto. Generalmente, la cantidad de sangre disminuye y el color se hace ms claro con el transcurso del [place]. Sin embargo, si usted est [employer], el color de la sangre puede ser rojo brillante. Si necesita  cambiarse la compresa higinica en menos de una hora o tiene cogulos grandes:  Permanezca acostada.  Eleve los pies.  Coloque compresas fras en la zona inferior del abdomen.  Haga reposo.  Comunquese con su mdico. Si est amamantando, podra  volver a tener su perodo [place] 8 semanas despus del parto y el momento en que deje de Museum/gallery exhibitions officer. Si no est amamantando, volver a tener su perodo 6 u 8 semanas despus del [place]. CUIDADOS PERINEALES La zona perineal o perineo, es la parte del cuerpo que se encuentra entre los muslos. Despus del parto, esta zona necesita un cuidado especial. Siga las siguientes indicaciones como se lo haya indicado su mdico.  Tome baos de inmersin durante 15 o 20 minutos.  Utilice apsitos o [employer] y Product/process development scientist se lo hayan indicado.  No utilice tampones ni se haga duchas vaginales hasta que el sangrado vaginal se haya detenido.  Cada vez que vaya al bao:  Use una botella perineal.  Cmbiese el apsito.  Use papel tis en lugar de papel higinico hasta que se cure la sutura.  Haga ejercicios de [employer]. Los ejercicios Kegel ayudan a Pharmacologist los msculos que sostienen la vagina, la vejiga y los intestinos. Estos ejercicios se pueden Ecologist est parada, sentada o acostada. Para hacer los ejercicios de Kegel:  Tense los msculos del estmago y los que rodean el canal de [address].  Mantenga esta posicin durante unos segundos.  Reljese.  Repita hasta hacerlos 5 veces seguidas.  Para evitar las hemorroides o que estas empeoren:  Beba suficiente lquido para Pharmacologist la orina clara o de color amarillo plido.  Evite hacer fuerza al defecar.  Tome los [employer] y laxantes de venta libre como se lo haya indicado el mdico. CUIDADO DE LAS MAMAS  Use un buen sostn.  Evite tomar analgsicos de venta libre para las [employer].  Aplique hielo en los pechos para Paramedic las molestias tanto como sea necesario:  Ponga el hielo en una bolsa plstica.  Coloque una toalla entre la piel y la bolsa de hielo.  Aplique el hielo durante 20, o como se lo haya indicado el mdico. NUTRICIN  Mantenga una dieta bien balanceada.  No intente  perder de peso rpidamente reduciendo el consumo de caloras.  Tome sus vitaminas prenatales hasta el control de postparto o hasta que su mdico se lo indique. DEPRESIN POSTPARTO Puede sentir deseos de llorar sin motivo aparente y verse incapaz de enfrentarse a todos los cambios que implica tener un beb. Este estado de nimo se llama depresin postparto. La depresin postparto ocurre porque sus niveles hormonales sufren cambios despus del [place]. Si usted tiene depresin postparto, busque contencin por parte de su pareja, sus amigos y su familia. Si la depresin no desaparece por s sola despus de algunas semanas, concurra a su mdico. AUTOEXAMEN DE MAMAS Realcese autoexmenes en el mismo momento cada mes. Si est amamantando, el mejor momento de controlar sus mamas es despus de Corporate treasurer al beb, cuando los pechos no estn tan llenos. Si est amamantando y su perodo ya comenz, controle sus mamas el da 5, 6 o 7 de su perodo. Informe a su mdico de cualquier protuberancia, bulto o secrecin. Si est amamantando, las [employer]. Esto es transitorio y no es un riesgo para Radiographer, therapeutic. INTIMIDAD Y SEXUALIDAD Debe evitar las relaciones sexuales durante al menos 3 o 4 semanas despus del parto o hasta que el flujo de color rojo  amarronado haya desaparecido completamente. Si no desea quedar embarazada nuevamente, use algn mtodo anticonceptivo. Despus del parto, puede quedar embarazada incluso si no ha tenido todava el perodo. SOLICITE ATENCIN MDICA SI:  Se siente incapaz de controlar los cambios que implica tener un hijo y esos sentimientos no desaparecen despus de Armed forces technical officer.  Detecta una protuberancia, bulto o secrecin en sus mamas. SOLICITE ATENCIN MDICA DE INMEDIATO SI:  Debe cambiarse la compresa higinica en 1 hora o menos.  Tiene los siguientes sntomas:  Dolor intenso o calambres en la parte inferior del abdomen.  Una secrecin vaginal con mal  olor.  Fiebre que no se [employer].  Una zona de la mama se pone roja y Presenter, broadcasting, y adems usted tiene fiebre.  Una pantorrilla enrojecida y con dolor.  Repentino e intenso dolor en el pecho.  Falta de aire.  Miccin dolorosa o con sangre.  Problemas visuales.  Vmitos durante 12 horas o ms.  Dolor de cabeza intenso.  Tiene pensamientos serios acerca de lastimarse a usted misma o daar al nio o a otra persona. Esta informacin no tiene Theme park manager el consejo del mdico. Asegrese de hacerle al mdico cualquier pregunta que tenga. Document Released: 01/18/2005 Document Revised: 05/12/2015 Document Reviewed: 07/22/2014 Elsevier Interactive Patient Education  2017 [employer].

## 2016-05-27 ENCOUNTER — Inpatient Hospital Stay (HOSPITAL_COMMUNITY)
Admission: AD | Admit: 2016-05-27 | Discharge: 2016-05-27 | Disposition: A | Discharge status: Home / Self Care | Payer: Medicaid Other | Source: Ambulatory Visit | Attending: Obstetrics & Gynecology | Admitting: Obstetrics & Gynecology

## 2016-05-27 DIAGNOSIS — O1205 Gestational edema, complicating the puerperium: Secondary | ICD-10-CM | POA: Diagnosis not present

## 2016-05-27 DIAGNOSIS — R030 Elevated blood-pressure reading, without diagnosis of hypertension: Secondary | ICD-10-CM | POA: Insufficient documentation

## 2016-05-27 DIAGNOSIS — Z880 Allergy status to penicillin: Secondary | ICD-10-CM | POA: Insufficient documentation

## 2016-05-27 LAB — URINALYSIS, ROUTINE W REFLEX MICROSCOPIC
BILIRUBIN URINE: NEGATIVE
Glucose, UA: NEGATIVE mg/dL
Ketones, ur: NEGATIVE mg/dL
Nitrite: NEGATIVE
Protein, ur: NEGATIVE mg/dL
SPECIFIC GRAVITY, URINE: 1.012 (ref 1.005–1.030)
pH: 6 (ref 5.0–8.0)

## 2016-05-27 LAB — COMPREHENSIVE METABOLIC PANEL
ALK PHOS: 122 U/L (ref 38–126)
ALT: 37 U/L (ref 14–54)
AST: 59 U/L — AB (ref 15–41)
Albumin: 2.8 g/dL — ABNORMAL LOW (ref 3.5–5.0)
Anion gap: 8 (ref 5–15)
BILIRUBIN TOTAL: 0.2 mg/dL — AB (ref 0.3–1.2)
BUN: 19 mg/dL (ref 6–20)
CHLORIDE: 105 mmol/L (ref 101–111)
CO2: 25 mmol/L (ref 22–32)
Calcium: 9 mg/dL (ref 8.9–10.3)
Creatinine, Ser: 0.55 mg/dL (ref 0.44–1.00)
GFR calc Af Amer: >60 mL/min (ref >60)
GLUCOSE: 86 mg/dL (ref 65–99)
POTASSIUM: 3.8 mmol/L (ref 3.5–5.1)
Sodium: 138 mmol/L (ref 135–145)
Total Protein: 6 g/dL — ABNORMAL LOW (ref 6.5–8.1)

## 2016-05-27 LAB — PROTEIN / CREATININE RATIO, URINE
CREATININE, URINE: 34 mg/dL
Total Protein, Urine: <6 mg/dL

## 2016-05-27 LAB — CBC
HEMATOCRIT: 34.3 % — AB (ref 36.0–46.0)
HEMOGLOBIN: 11.9 g/dL — AB (ref 12.0–15.0)
MCH: 29.8 pg (ref 26.0–34.0)
MCHC: 34.7 g/dL (ref 30.0–36.0)
MCV: 86 fL (ref 78.0–100.0)
PLATELETS: 236 10*3/uL (ref 150–400)
RBC: 3.99 MIL/uL (ref 3.87–5.11)
RDW: 14.7 % (ref 11.5–15.5)
WBC: 8.3 10*3/uL (ref 4.0–10.5)

## 2016-05-27 NOTE — MAU Note (Signed)
Pt having swelling in legs, feet and hands. Gave birth on Monday. Has been increasing since. No headache, no visual changes. Just feeling weakness on her legs.

## 2016-05-27 NOTE — Discharge Instructions (Signed)
Hipertensión posparto  (Postpartum Hypertension)  La hipertensión posparto es cuando la presión arterial se mantiene más alta que lo normal durante más de dos días después del parto. Puede no darse cuenta de que tiene hipertensión posparto si no le miden la presión arterial con regularidad. En algunos casos, la hipertensión posparto desaparece sola, por lo general en la semana posterior al parto. Sin embargo, algunas mujeres requieren tratamiento médico para prevenir complicaciones graves, como convulsiones o un ictus.  Entre las cosas que pueden influir en la presión arterial, se incluye lo siguiente:  · El tipo de parto.  · La administración de líquidos u otros medicamentos por vía intravenosa durante o después del parto.  CAUSAS  La hipertensión posparto puede ser consecuencia de cualquiera de los siguientes factores o de la combinación de cualquiera de ellos:  · Hipertensión que existía antes del embarazo (hipertensión crónica).  · Hipertensión gestacional.  · Preeclampsia o eclampsia.  · Administración de mucho líquido a través de una vía intravenosa durante o después del parto.  · Medicamentos.  · Síndrome de HELLP  · Hipertiroidismo.  · Ictus.  · Otros trastornos neurológicos o sanguíneos poco frecuentes.  En algunos casos, es posible que la causa no se conozca.  FACTORES DE RIESGO  La hipertensión posparto puede relacionarse con uno o más factores de riesgo, por ejemplo:  · Hipertensión crónica. En algunos casos, es posible que esta no se haya diagnosticado antes del embarazo.  · Obesidad.  · Diabetes tipo 2.  · Enfermedad renal.  · Antecedentes familiares de preeclampsia.  · Otras enfermedades que causan desequilibrios hormonales.  SIGNOS Y SÍNTOMAS  Al igual que con todos los tipos de hipertensión, la hipertensión posparto puede no causar ningún síntoma. Según lo alta que esté la presión arterial, puede presentar lo siguiente:  · Dolores de cabeza. Estos pueden ser leves, moderados o intensos. También  pueden ser regulares, constantes o de inicio repentino (cefalea en estallido).  · Cambios en la visión.  · Mareos.  · Falta de aire.  · Hinchazón de las manos, los pies, la parte inferior de las piernas o la cara. En algunos casos, puede tener hinchazón en varias de estas áreas.  · Palpitaciones o latidos cardíacos acelerados.  · Dificultad para respirar al estar acostada.  · Disminución de la cantidad de orina.  Otros signos y síntomas poco frecuentes pueden incluir lo siguiente:  · Más sudoración que la habitual. Esta dura más que unos días después del parto.  · Dolor en el pecho.  · Mareos repentinos al levantarse después de haber estado sentada o acostada.  · Convulsiones.  · Náuseas o vómitos.  · Dolor abdominal.  DIAGNÓSTICO  El diagnóstico de hipertensión posparto se establece mediante la combinación de los resultados de los exámenes físicos y los análisis de sangre y orina. También pueden realizarle estudios adicionales, como una tomografía computarizada o una resonancia magnética, para detectar otras complicaciones de la hipertensión posparto.  TRATAMIENTO  Cuando la presión arterial está lo suficientemente alta como para requerir tratamiento, las opciones incluyen lo siguiente:  · Medicamentos para disminuir la presión arterial (antihipertensivos). Dígale al médico si está amamantando o si planifica hacerlo. Hay muchos medicamentos antihipertensivos que pueden tomarse sin riesgos durante la lactancia.  · Interrupción de los medicamentos que puedan causar la hipertensión.  · Tratamiento de las enfermedades que causan la hipertensión.  · Tratamiento de las complicaciones de la hipertensión, como convulsiones, ictus o problemas renales.  El médico seguirá supervisando atentamente y de   forma repetida su presión arterial hasta que esta se encuentre en un nivel seguro para usted.  INSTRUCCIONES PARA EL CUIDADO EN EL HOGAR  · Tome los medicamentos solamente como se lo haya indicado el médico.  · Haga actividad  física con regularidad después de que el médico le diga que es seguro hacerlo.  · Siga las recomendaciones de su médico sobre los límites en el consumo de sal y líquido.  · No consuma ningún producto que contenga tabaco, lo que incluye cigarrillos, tabaco de mascar o cigarrillos electrónicos. Si necesita ayuda para dejar de fumar, consulte al médico.  · Concurra a todas las visitas de control como se lo haya indicado el médico. Esto es importante.    SOLICITE ATENCIÓN MÉDICA SI:  · Los síntomas empeoran.  · Aparecen nuevos síntomas, por ejemplo:  ? Dolor de cabeza.  ? Mareos.  ? Cambios en la visión.    SOLICITE ATENCIÓN MÉDICA DE INMEDIATO SI:  · Tiene un dolor de cabeza intenso o repentino.  · Tiene convulsiones.  · Siente debilidad o adormecimiento en un lado del cuerpo.  · Tiene dificultades para pensar, hablar o tragar.  · Sufre un dolor abdominal intenso.  · Tiene dificultad para respirar, dolor en el pecho, latidos cardíacos acelerados o palpitaciones.  Estos síntomas pueden representar un problema grave que constituye una emergencia. No espere hasta que los síntomas desaparezcan. Solicite atención médica de inmediato. Comuníquese con el servicio de emergencias de su localidad (911 en los [place]). No conduzca por sus propios medios hasta el hospital.  Esta información no tiene como fin reemplazar el consejo del médico. Asegúrese de hacerle al médico cualquier pregunta que tenga.  Document Released: 11/02/2013 Document Revised: 11/02/2013 Document Reviewed: 08/02/2013  Elsevier Interactive Patient Education © 2017 Elsevier Inc.

## 2016-05-27 NOTE — MAU Provider Note (Signed)
History     CSN: [id number]  Arrival date and time: 05/27/16 1707  First Provider Initiated Contact with Patient 05/27/16 1802      Chief Complaint  Patient presents with  . Leg Swelling   HPI Beth Brown is a 36 y.o. [phone] female who presents for postpartum swelling. Patient is 3 days s/p SVD. States she has been swelling since the end of her pregnancy & it has gotten slightly worse since delivery. Complains of swelling in bilateral legs & hands. Denies headache, visual disturbance, chest pain, SOB, cough, epigastric pain, or calf pain.   OB History    Gravida Para Term Preterm AB Living   0 4   SAB TAB Ectopic Multiple Live Births         0 4      Past Medical History:  Diagnosis Date  . Eczema   . GERD (gastroesophageal reflux disease)     Past Surgical History:  Procedure Laterality Date  . NO PAST SURGERIES      Family History  Problem Relation Age of Onset  . Cancer Maternal Aunt     Social History  Substance Use Topics  . Smoking status: Never Smoker  . Smokeless tobacco: Never Used  . Alcohol use No    Allergies:  Allergies  Allergen Reactions  . Penicillins Hives    Has patient had a PCN reaction causing immediate rash, facial/tongue/throat swelling, SOB or lightheadedness with hypotension: Yes Has patient had a PCN reaction causing severe rash involving mucus membranes or skin necrosis: Yes Has patient had a PCN reaction that required hospitalization No Has patient had a PCN reaction occurring within the last 10 years: No If all of the above answers are "NO", then may proceed with Cephalosporin use.    Prescriptions Prior to Admission  Medication Sig Dispense Refill Last Dose  . ibuprofen (ADVIL,MOTRIN) 600 MG tablet Take 1 tablet (600 mg total) by mouth every 6 (six) hours. 30 tablet 0 05/27/2016 at Unknown time  . Prenatal Vit-Fe Fumarate-FA (PRENATAL VITAMIN) 27-0.8 MG TABS Take 1 tablet by mouth daily. 30 tablet 12  05/26/2016 at Unknown time  . oxyCODONE-acetaminophen (ROXICET) 5-325 MG tablet Take 1 tablet by mouth every 6 (six) hours as needed for severe pain. (Patient not taking: Reported on 05/27/2016) 20 tablet 0 Not Taking at Unknown time  . polyethylene glycol (MIRALAX / GLYCOLAX) packet Take 17 g by mouth daily as needed for moderate constipation. (Patient not taking: Reported on 05/27/2016) 14 each 0 Not Taking at Unknown time    Review of Systems  Constitutional: Negative.   Eyes: Negative for visual disturbance.  Respiratory: Negative.   Cardiovascular: Positive for leg swelling. Negative for chest pain.  Gastrointestinal: Negative.   Genitourinary: Positive for vaginal discharge. Negative for dysuria.  Neurological: Negative for dizziness and headaches.   Physical Exam   Blood pressure 130/63, pulse (!) 58, temperature 98.3 F (36.8 C), resp. rate 16, unknown if currently breastfeeding.  Temp:  [98.3 F (36.8 C)] 98.3 F (36.8 C) (04/26 1738) Pulse Rate:  [58-67] 58 (04/26 1944) Resp:  [16-18] 16 (04/26 1947) BP: (127-142)/(63-79) 130/63 (04/26 1944)  Physical Exam  Nursing note and vitals reviewed. Constitutional: She is oriented to person, place, and time. She appears well-developed and well-nourished. No distress.  HENT:  Head: Normocephalic and atraumatic.  Eyes: Conjunctivae are normal. Right eye exhibits no discharge. Left eye exhibits no discharge. No scleral icterus.  Neck: Normal  range of motion.  Cardiovascular: Normal rate, regular rhythm, normal heart sounds and intact distal pulses.   No murmur heard. Pulses:      Dorsalis pedis pulses are 2+ on the right side, and 2+ on the left side.  Respiratory: Effort normal and breath sounds normal. No respiratory distress. She has no wheezes.  GI: Soft. Bowel sounds are normal. There is no tenderness.  Musculoskeletal: She exhibits edema (BLE, mild pitting edema).  Neurological: She is alert and oriented to person, place, and  time. She has normal reflexes.  No clonus  Skin: Skin is warm and dry. She is not diaphoretic.  Psychiatric: She has a normal mood and affect. Her behavior is normal. Judgment and thought content normal.    MAU Course  Procedures Results for orders placed or performed during the hospital encounter of 05/27/16 (from the past 24 hour(s))  Urinalysis, Routine w reflex microscopic     Status: Abnormal   Collection Time: 05/27/16  5:10 PM  Result Value Ref Range   Color, Urine STRAW (A) YELLOW   APPearance CLEAR CLEAR   Specific Gravity, Urine 1.012 1.005 - 1.030   pH 6.0 5.0 - 8.0   Glucose, UA NEGATIVE NEGATIVE mg/dL   Hgb urine dipstick LARGE (A) NEGATIVE   Bilirubin Urine NEGATIVE NEGATIVE   Ketones, ur NEGATIVE NEGATIVE mg/dL   Protein, ur NEGATIVE NEGATIVE mg/dL   Nitrite NEGATIVE NEGATIVE   Leukocytes, UA MODERATE (A) NEGATIVE   RBC / HPF 6-30 0 - 5 RBC/hpf   WBC, UA 6-30 0 - 5 WBC/hpf   Bacteria, UA FEW (A) NONE SEEN   Squamous Epithelial / LPF 0-5 (A) NONE SEEN   Mucous PRESENT   Protein / creatinine ratio, urine     Status: None   Collection Time: 05/27/16  6:21 PM  Result Value Ref Range   Creatinine, Urine 34.00 mg/dL   Total Protein, Urine <6 mg/dL   Protein Creatinine Ratio        0.00 - 0.15 mg/mg[Cre]  CBC     Status: Abnormal   Collection Time: 05/27/16  6:22 PM  Result Value Ref Range   WBC 8.3 4.0 - 10.5 K/uL   RBC 3.99 3.87 - 5.11 MIL/uL   Hemoglobin 11.9 (L) 12.0 - 15.0 g/dL   HCT 96.0 (L) 45.4 - 09.8 %   MCV 86.0 78.0 - 100.0 fL   MCH 29.8 26.0 - 34.0 pg   MCHC 34.7 30.0 - 36.0 g/dL   RDW 11.9 14.7 - 82.9 %   Platelets 236 150 - 400 K/uL  Comprehensive metabolic panel     Status: Abnormal   Collection Time: 05/27/16  6:22 PM  Result Value Ref Range   Sodium 138 135 - 145 mmol/L   Potassium 3.8 3.5 - 5.1 mmol/L   Chloride 105 101 - 111 mmol/L   CO2 25 22 - 32 mmol/L   Glucose, Bld 86 65 - 99 mg/dL   BUN 19 6 - 20 mg/dL   Creatinine, Ser 5.62 0.44  - 1.00 mg/dL   Calcium 9.0 8.9 - 13.0 mg/dL   Total Protein 6.0 (L) 6.5 - 8.1 g/dL   Albumin 2.8 (L) 3.5 - 5.0 g/dL   AST 59 (H) 15 - 41 U/L   ALT 37 14 - 54 U/L   Alkaline Phosphatase 122 38 - 126 U/L   Total Bilirubin 0.2 (L) 0.3 - 1.2 mg/dL   GFR calc non Af Amer >60 >60 mL/min   GFR calc  Af Amer >60 >60 mL/min   Anion gap 8 5 - 15    MDM Elevated BP x 1, not severe range; otherwise normotensive CBC, CMP, cath urine PCR Mild swelling of BLE; equal in size, no evidence of DVT PCR below reportable range Assessment and Plan  A; 1. Postpartum edema   2. Elevated BP without diagnosis of hypertension    P: Discharge home Increase water intake Limit dietary sodium Discussed reasons to return to MAU Keep f/u with OB  Judeth Horn 05/27/2016, 5:54 PM

## 2016-07-06 ENCOUNTER — Ambulatory Visit: Payer: Self-pay | Admitting: Clinical

## 2016-07-06 ENCOUNTER — Encounter: Payer: Self-pay | Admitting: Advanced Practice Midwife

## 2016-07-06 ENCOUNTER — Ambulatory Visit (INDEPENDENT_AMBULATORY_CARE_PROVIDER_SITE_OTHER): Payer: Self-pay | Admitting: Advanced Practice Midwife

## 2016-07-06 VITALS — BP 135/71 | HR 69 | Wt 156.6 lb

## 2016-07-06 DIAGNOSIS — F4323 Adjustment disorder with mixed anxiety and depressed mood: Secondary | ICD-10-CM

## 2016-07-06 DIAGNOSIS — Z30011 Encounter for initial prescription of contraceptive pills: Secondary | ICD-10-CM

## 2016-07-06 MED ORDER — NORETHINDRONE 0.35 MG PO TABS: 1.0000 | ORAL_TABLET | Freq: Every day | ORAL | 12 refills | Status: AC

## 2016-07-06 NOTE — Patient Instructions (Addendum)
Información sobre el dispositivo intrauterino  (Intrauterine Device Information)  Un dispositivo intrauterino (DIU) se inserta en el útero e impide el embarazo. Hay dos tipos de DIU:  · DIU de cobre: este tipo de DIU está recubierto con un alambre de cobre y se inserta dentro del útero. El cobre hace que el útero y las trompas de [employer]op[employer] produzcan un liquido que destruye los espermatozoides. El DIU de cobre puede permanecer en el lugar durante 10 años.  · DIU con hormona: este tipo de DIU contiene la hormona progestina (progesterona sintética). Las hormonas hacen que el moco cervical se haga más espeso, lo que evita que el esperma ingrese al útero. También hace que la membrana que recubre internamente al útero sea más delgada lo que impide el implante del óvulo fertilizado. La hormona debilita o destruye los espermatozoides que ingresan al útero. Alguno de los tipos de DIU hormonal pueden permanecer en el lugar durante 5 años y otros tipos pueden dejarse en el lugar por 3 años.  El médico se asegurará de que usted sea una buena candidata para usar el DIU. Converse con su médico acerca de los posibles efectos secundarios.  VENTAJASDEL DISPOSITIVO INTRAUTERINO  · El DIU es muy eficaz, reversible, de acción prolongada y de bajo mantenimiento.  · No hay efectos secundarios relacionados con el estrógeno.  · El DIU puede ser utilizado durante la lactancia.  · No está asociado con el aumento de peso.  · Funciona inmediatamente después de la inserción.  · El DIU hormonal funciona inmediatamente si se inserta dentro de los 7 días del inicio del período. Será necesario que utilice un método anticonceptivo adicional durante 7 días si el DIU hormonal se inserta en algún otro momento del ciclo.  · El DIU de cobre no interfiere con las hormonas femeninas.  · El DIU hormonal puede hacer que los períodos menstruales abundantes se hagan más ligeros y que haya menos cólicos.  · El DIU hormonal puede usarse durante 3 a 5 años.   · El DIU de cobre puede usarse durante 10 años.    DESVENTAJASDEL DISPOSITIVO INTRAUTERINO  · El DIU hormonal puede estar asociado con patrones de sangrado irregular.  · El DIU de cobre puede hacer que el flujo menstrual más abundante y doloroso.  · Puede experimentar cólicos y sangrado vaginal después de la inserción.    Esta información no tiene como fin reemplazar el consejo del médico. Asegúrese de hacerle al médico cualquier pregunta que tenga.  Document Released: 07/08/2009 Document Revised: 05/12/2015 Document Reviewed: 07/09/2012  Elsevier Interactive Patient Education © 2017 Elsevier Inc.

## 2016-07-06 NOTE — Progress Notes (Signed)
Patient is not sexually active now, but she is interested in the 10 year IUD. Patient was on the Adopt a mom program. Subjective:     Beth Brown is a 36 y.o. female who presents for a postpartum visit. She is 6 weeks postpartum following a spontaneous vaginal delivery. I have fully reviewed the prenatal and intrapartum course. The delivery was at 40.6 gestational weeks. Outcome: spontaneous vaginal delivery and with shoulder dystocia. Anesthesia: none. Postpartum course has been Uncomplicated. Baby's course has been uncomplicated. Baby is feeding by breast. Bleeding staining only. Bowel function is abnormal: constipation. . Bladder function is normal. Patient is not sexually active. Contraception method is none. Postpartum depression screening: positive. Reports some sadness and stress taking care of 4 children w/ little support. FOB works long hours.   Last Pap 2017 Nml w/ neg HRHPV.   The following portions of the patient's history were reviewed and updated as appropriate: allergies, current medications, past family history, past medical history, past social history, past surgical history and problem list.  Review of Systems Pertinent items are noted in HPI.  Occasional breast pain. No redness, mass or drainage. SP pain w/ ambulation.   Objective:    BP 135/71   Pulse 69   Wt 156 lb 9.6 oz (71 kg)   BMI 28.64 kg/m   General:  alert, cooperative, appears stated age and no distress   Breasts:  inspection negative, no nipple discharge or bleeding, no masses or nodularity palpable  Lungs: clear to auscultation bilaterally  Heart:  regular rate and rhythm, S1, S2 normal, no murmur, click, rub or gallop  Abdomen: soft, non-tender; bowel sounds normal; no masses,  no organomegaly   Vulva:  normal  Vagina: normal vagina  Cervix:  no cervical motion tenderness  Corpus: normal size, contour, position, consistency, mobility, non-tender  Adnexa:  normal adnexa and no mass, fullness,  tenderness  Rectal Exam: Not performed.        Assessment:   Nml postpartum exam. Pap smear not done at today's visit.  Constipation   Plan:    1. Contraception: Planning Copper IUD, but is self-pay. [facility]. Rx oral progesterone-only contraceptive for now.  2. Increase fluids, fiber physical activity .  3. Follow up in: 1 year or as needed.

## 2016-07-06 NOTE — BH Specialist Note (Signed)
Integrated Behavioral Health Follow Up Visit  MRN: [id number] [facility]   Session Start time: 11:20 Session End time: 11:34 Total time: 15 minutes Number of Integrated Behavioral Health Clinician visits: 3/10  Type of Service: Integrated Behavioral Health- Individual/Family Interpretor:Yes.   Interpretor Name and Language: Seward GraterMaggie, Spanish   Warm Hand Off Completed.       SUBJECTIVE: Beth Brown Beth HaberJuan Brown is a 36 y.o. female accompanied by patient and daughter. Patient was referred by Beth KinsmanVirginia Brown, CNM for depression, anxiety. Patient reports the following symptoms/concerns: Pt states that her primary concern today is feeling stress over her 34yo acting jealous about a new baby in the house; previously she coped with stress by taking walks, but has gotten out of the habit postpartum. Duration of problem: postpartum; Severity of problem: moderate  OBJECTIVE: Mood: Appropriate and Affect: Appropriate Risk of harm to self or others: No plan to harm self or others   LIFE CONTEXT: Family and Social: lives with husband and children School/Work: - Self-Care: -Used to take walks for self-care; no substances, sleeping and eating well Life Changes: Recent childbirth  GOALS ADDRESSED: Patient will reduce symptoms of: anxiety and depression and increase knowledge and/or ability of: coping skills and also: Increase healthy adjustment to current life circumstances  INTERVENTIONS: Supportive Counseling and Link to [facility] Standardized Assessments completed: MOCA and PHQ 9  ASSESSMENT: Patient currently experiencing Adjustment disorder with depressed and anxious mood. Patient may benefit from continued brief therapeutic interventions and referral to community behavioral health for individual and family therapy.  PLAN: 1. Follow up with behavioral health clinician on : As needed 2. Behavioral recommendations:  -Consider [facility] to [facility] up appointment  [facility]-speaking counselor for individual and/or family counseling  -Consider at least 20 minutes/day outside taking a walk -Consider setting aside time each day to spend with 36yo by himself (At least 5 minutes undivided attention) 3. Referral(s): Integrated [facility] (In Clinic) and [facility]  Beth LipsJamie C Brown, [place]  Dep[facility] 07/06/2016 05/10/2016 05/03/2016 04/26/2016 04/19/2016  Decreased Interest 1 1 1  0 1  Down, Depressed, Hopeless 0 0 0 0 0  PHQ - 2 Score 1 1 1  0 1  Altered sleeping 1 2 2 1 2   Tired, decreased energy 3 2 2 2 2   Change in appetite 2 2 1 2 1   Feeling bad or failure about yourself  1 1 1 1 1   Trouble concentrating 3 2 2 1 1   Moving slowly or fidgety/restless 2 2 2 1 1   Suicidal thoughts 0 0 0 0 0  PHQ-9 Score 13 12 11 8 9   Some recent data might be hidden   GAD 7 : Generalized Anxiety Score 07/06/2016 05/10/2016 05/03/2016 04/26/2016  Nervous, Anxious, on Edge 1 2 2 2   Control/stop worrying 1 2 1 2   Worry too much - different things 1 2 2 1   Trouble relaxing 1 2 2 1   Restless 1 1 2 1   Easily annoyed or irritable 3 1 2 2   Afraid - awful might happen 1 2 1 2   Total GAD 7 Score 9 12 12  11

## 2017-08-20 ENCOUNTER — Other Ambulatory Visit: Payer: Self-pay

## 2017-08-20 ENCOUNTER — Ambulatory Visit (HOSPITAL_COMMUNITY)
Admission: EM | Admit: 2017-08-20 | Discharge: 2017-08-20 | Disposition: A | Discharge status: Home / Self Care | Payer: Self-pay | Attending: Family Medicine | Admitting: Family Medicine

## 2017-08-20 ENCOUNTER — Encounter (HOSPITAL_COMMUNITY): Payer: Self-pay | Admitting: *Deleted

## 2017-08-20 DIAGNOSIS — Z79899 Other long term (current) drug therapy: Secondary | ICD-10-CM | POA: Insufficient documentation

## 2017-08-20 DIAGNOSIS — Z79891 Long term (current) use of opiate analgesic: Secondary | ICD-10-CM | POA: Insufficient documentation

## 2017-08-20 DIAGNOSIS — Z809 Family history of malignant neoplasm, unspecified: Secondary | ICD-10-CM | POA: Insufficient documentation

## 2017-08-20 DIAGNOSIS — Z9109 Other allergy status, other than to drugs and biological substances: Secondary | ICD-10-CM | POA: Insufficient documentation

## 2017-08-20 DIAGNOSIS — N3090 Cystitis, unspecified without hematuria: Secondary | ICD-10-CM | POA: Insufficient documentation

## 2017-08-20 DIAGNOSIS — Z880 Allergy status to penicillin: Secondary | ICD-10-CM | POA: Insufficient documentation

## 2017-08-20 LAB — POCT URINALYSIS DIP (DEVICE)
Bilirubin Urine: NEGATIVE
GLUCOSE, UA: NEGATIVE mg/dL
KETONES UR: NEGATIVE mg/dL
Nitrite: POSITIVE — AB
PH: 6 (ref 5.0–8.0)
Urobilinogen, UA: 0.2 mg/dL (ref 0.0–1.0)

## 2017-08-20 MED ORDER — SULFAMETHOXAZOLE-TRIMETHOPRIM 800-160 MG PO TABS: 1.0000 | ORAL_TABLET | Freq: Two times a day (BID) | ORAL | 0 refills | Status: AC

## 2017-08-20 NOTE — ED Provider Notes (Signed)
[facility]    CSN: [id number] Arrival date & time: 08/20/17  1635     History   Chief Complaint Chief Complaint  Patient presents with  . Urinary Tract Infection    HPI [facility] Beth Brown is a 37 y.o. female.   37 year old female comes in for 2-day history of dysuria, urinary frequency, hesitancy.  She is also had some vaginal discharge without vaginal itching or irritation.  Denies abdominal pain, nausea, vomiting.  Denies fever, chills, night sweats.  She is sexually active with one female partner, no condom use.  LMP 07/24/2017.  She has a IUD ParaGard.  Denies new hygiene products use.  Ibuprofen without relief.  Not breast-feeding.      Past Medical History:  Diagnosis Date  . Eczema   . GERD (gastroesophageal reflux disease)     Patient Active Problem List   Diagnosis Date Noted  . Keratitis 08/01/2012  . Acne 01/08/2011  . DYSTHYMIC DISORDER 08/22/2009  . HEMORRHOIDS, NOS 03/31/2006    Past Surgical History:  Procedure Laterality Date  . NO PAST SURGERIES      OB History    Gravida  4   Para  4   Term  4   Preterm      AB  0   Living  4     SAB      TAB      Ectopic      Multiple  0   Live Births  4            Home Medications    Prior to Admission medications   Medication Sig Start Date End Date Taking? Authorizing Provider  ibuprofen (ADVIL,MOTRIN) 600 MG tablet Take 1 tablet (600 mg total) by mouth every 6 (six) hours. 05/26/16  Yes Durenda Hurt, MD  norethindrone (MICRONOR,CAMILA,ERRIN) 0.35 MG tablet Take 1 tablet (0.35 mg total) by mouth daily. 07/06/16   Katrinka Blazing, [place], CNM  oxyCODONE-acetaminophen (ROXICET) 5-325 MG tablet Take 1 tablet by mouth every 6 (six) hours as needed for severe pain. Patient not taking: Reported on 05/27/2016 05/26/16   Durenda Hurt, MD  polyethylene glycol [facility] / Ethelene Hal) packet Take 17 g by mouth daily as needed for moderate constipation. Patient not taking: Reported  on 05/27/2016 05/26/16   Durenda Hurt, MD  sulfamethoxazole-trimethoprim (BACTRIM DS,SEPTRA DS) 800-160 MG tablet Take 1 tablet by mouth 2 (two) times daily for 3 days. 08/20/17 08/23/17  Belinda Fisher, PA-C    Family History Family History  Problem Relation Age of Onset  . Cancer Maternal Aunt     Social History Social History   Tobacco Use  . Smoking status: Never Smoker  . Smokeless tobacco: Never Used  Substance Use Topics  . Alcohol use: Yes    Comment: ocassionally  . Drug use: No     Allergies   Penicillins and Other   Review of Systems Review of Systems  Reason unable to perform ROS: See HPI as above.     Physical Exam Triage Vital Signs ED Triage Vitals  Enc Vitals Group     BP 08/20/17 1700 126/73     Pulse Rate 08/20/17 1700 66     Resp 08/20/17 1700 16     Temp 08/20/17 1700 98.3 F (36.8 C)     Temp Source 08/20/17 1700 Oral     SpO2 08/20/17 1700 99 %     Weight 08/20/17 1702 162 lb (73.5 kg)  Height --      Head Circumference --      Peak Flow --      Pain Score 08/20/17 1702 4     Pain Loc --      Pain Edu? --      Excl. in GC? --    No data found.  Updated Vital Signs BP 126/73   Pulse 66   Temp 98.3 F (36.8 C) (Oral)   Resp 16   Wt 162 lb (73.5 kg)   LMP 07/24/2017   SpO2 99%   BMI 29.63 kg/m   Physical Exam  Constitutional: She is oriented to person, place, and time. She appears well-developed and well-nourished. No distress.  HENT:  Head: Normocephalic and atraumatic.  Eyes: Pupils are equal, round, and reactive to light. Conjunctivae are normal.  Cardiovascular: Normal rate, regular rhythm and normal heart sounds. Exam reveals no gallop and no friction rub.  No murmur heard. Pulmonary/Chest: Effort normal and breath sounds normal. She has no wheezes. She has no rales.  Abdominal: Soft. Bowel sounds are normal. She exhibits no mass. There is no tenderness. There is no rebound, no guarding and no CVA tenderness.    Neurological: She is alert and oriented to person, place, and time.  Skin: Skin is warm and dry.  Psychiatric: She has a normal mood and affect. Her behavior is normal. Judgment normal.     UC Treatments / Results  Labs (all labs ordered are listed, but only abnormal results are displayed) Labs Reviewed  POCT URINALYSIS DIP (DEVICE) - Abnormal; Notable for the following components:      Result Value   Hgb urine dipstick LARGE (*)    Protein, ur >=300 (*)    Nitrite POSITIVE (*)    Leukocytes, UA LARGE (*)    All other components within normal limits  URINE CULTURE  CERVICOVAGINAL ANCILLARY ONLY    EKG None  Radiology No results found.  Procedures Procedures (including critical care time)  Medications Ordered in UC Medications - No data to display  Initial Impression / Assessment and Plan / UC Course  I have reviewed the triage vital signs and the nursing notes.  Pertinent labs & imaging results that were available during my care of the patient were reviewed by me and considered in my medical decision making (see chart for details).    Urine dipstick positive for UTI. Start antibiotics as directed. Push fluids. Cytology and urine culture sent.  Return precautions given.  Final Clinical Impressions(s) / UC Diagnoses   Final diagnoses:  Cystitis    ED Prescriptions    Medication Sig Dispense Auth. Provider   sulfamethoxazole-trimethoprim (BACTRIM DS,SEPTRA DS) 800-160 MG tablet Take 1 tablet by mouth 2 (two) times daily for 3 days. 6 tablet Threasa AlphaYu, Kymoni Lesperance V, PA-C        Brittish Bolinger V, [place] JerseyPA-C 08/20/17 [id number]

## 2017-08-20 NOTE — Discharge Instructions (Signed)
Your urine was positive for an urinary tract infection. Start bactrim as directed. Keep hydrated, your urine should be clear to pale yellow in color. Cytology sent, you will be contacted with any positive results that requires further treatment. Monitor for any worsening of symptoms, fever, abdominal pain, nausea, vomiting, to follow up for reevaluation.

## 2017-08-20 NOTE — ED Triage Notes (Signed)
Pt complain of dysuria x 2 days. Pt states she has had brown discoloration during urination for the past 5 days. Pt also c/o urinary frequency and lower back pain.

## 2017-08-22 LAB — URINE CULTURE: Culture: >=100000 — AB

## 2017-08-22 LAB — CERVICOVAGINAL ANCILLARY ONLY
Bacterial vaginitis: NEGATIVE
CHLAMYDIA, DNA PROBE: NEGATIVE
Candida vaginitis: NEGATIVE
Neisseria Gonorrhea: NEGATIVE
Trichomonas: NEGATIVE

## 2018-03-05 IMAGING — US US MFM FETAL BPP W/O NON-STRESS
1 series · 12 of 25 positions shown · non-contrast
Comparison: none

[Series 1: us mfm fetal bpp w/o non-stress · 25 acquisitions, 12 frames shown]
[im 2/25]
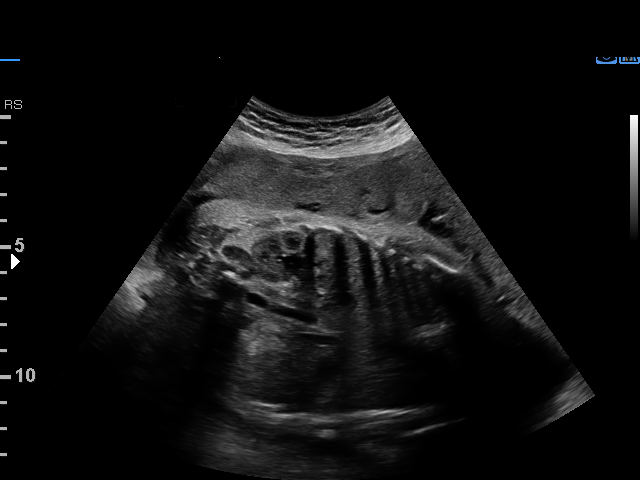
[im 4/25]
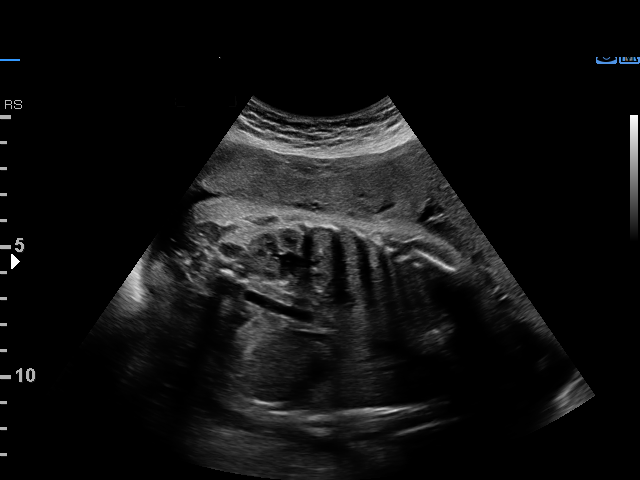
[im 6/25]
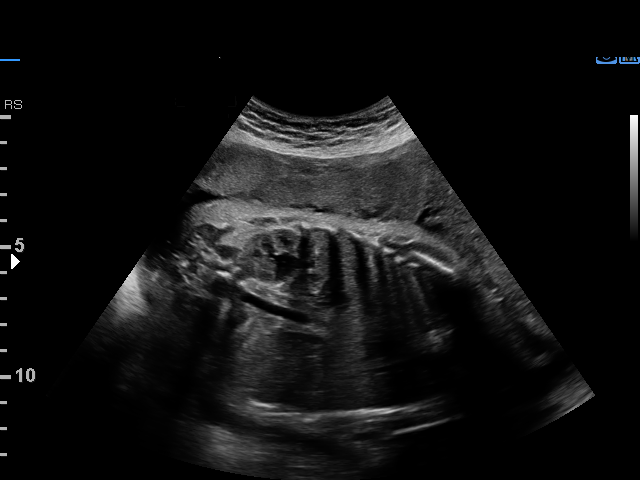
[im 8/25]
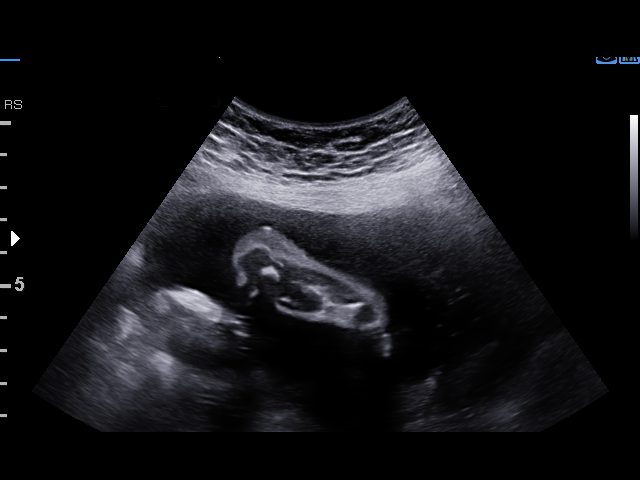
[im 10/25]
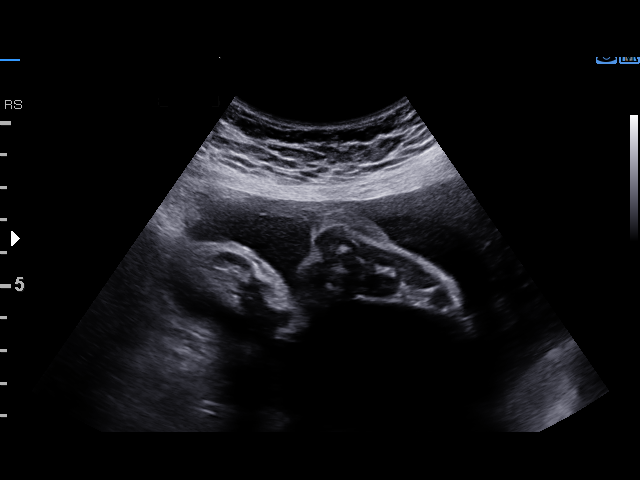
[im 12/25]
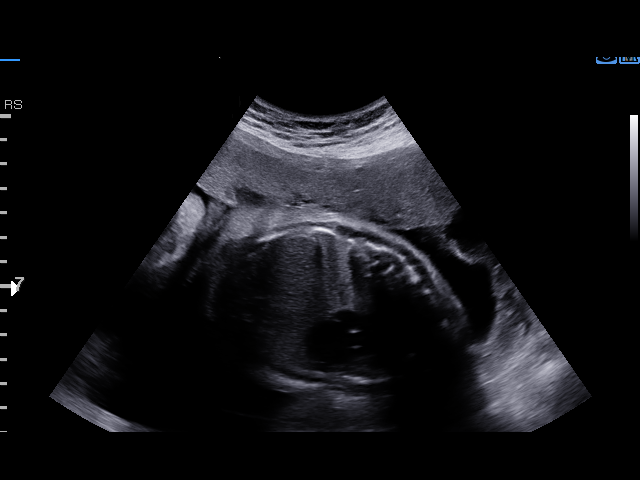
[im 14/25]
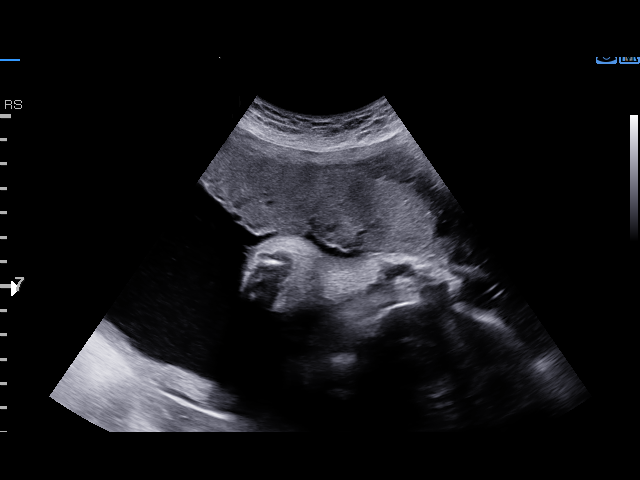
[im 16/25]
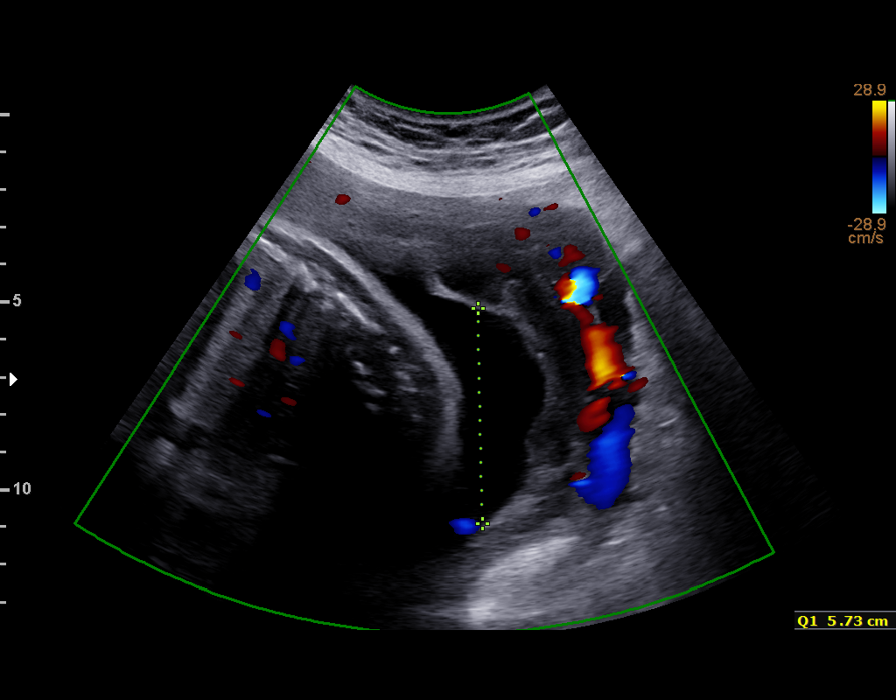
[im 18/25]
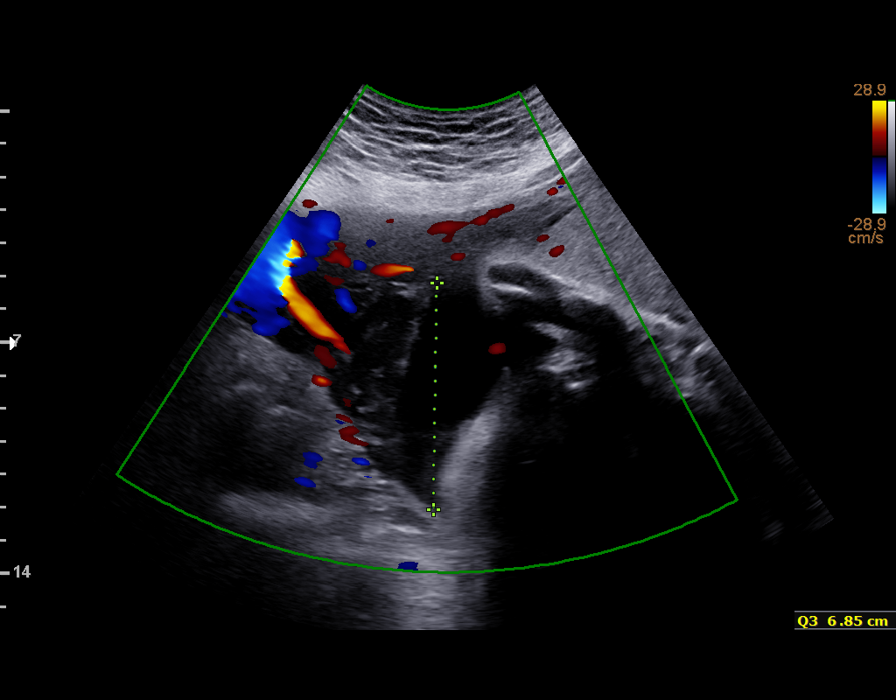
[im 20/25]
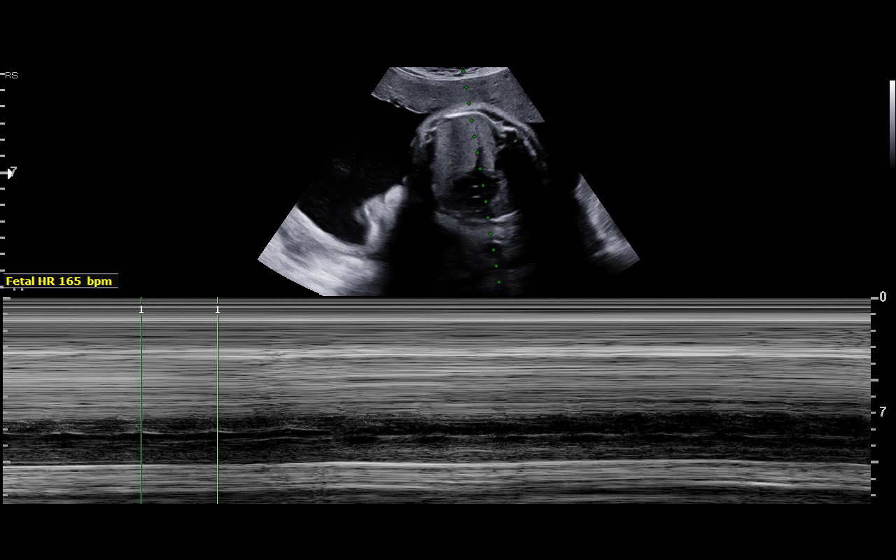
[im 22/25]
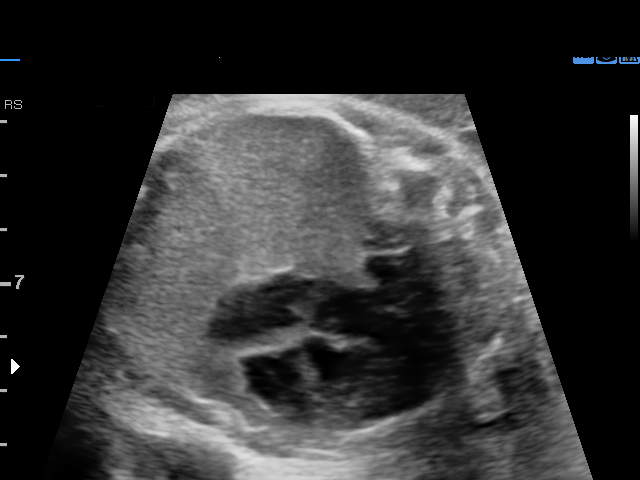
[im 24/25]
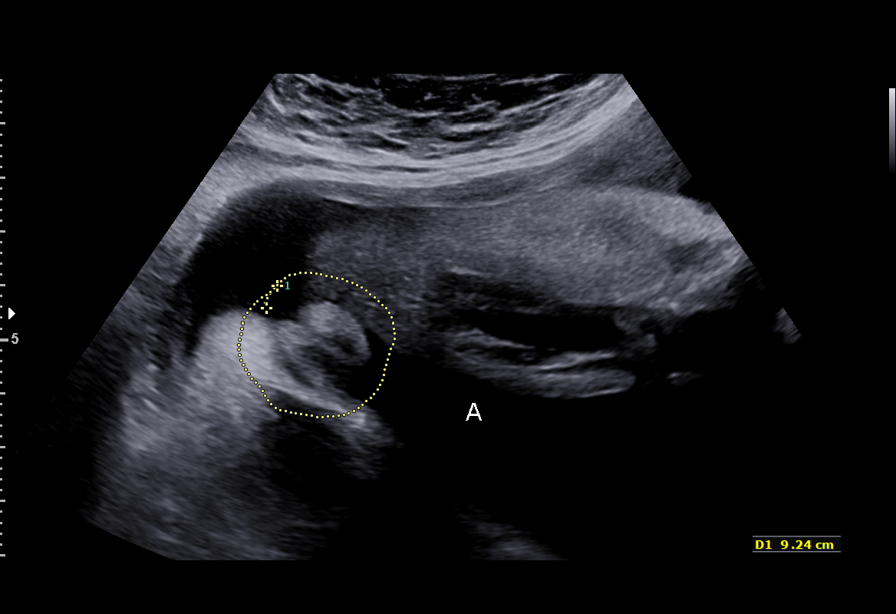

[12 of 25 positions shown; findings below may reference images not displayed]

[place]

[facility]
[place]

[address]           [phone]      [phone]     [phone]
Indications

30 weeks gestation of pregnancy
Obesity complicating pregnancy, third
trimester
Cholestasis of pregnancy, third trimester ??   [id number]
has rash
OB History

Blood Type:            Height:  5'1"   Weight (lb):  173      BMI:
Gravidity:    3         Term:   3        Prem:   0        SAB:   0
TOP:          0       Ectopic:  0        Living: 3
Fetal Evaluation

Num Of Fetuses:     1
Fetal Heart         165
Rate(bpm):
Cardiac Activity:   Observed
Presentation:       Cephalic

Amniotic Fluid
AFI FV:      Subjectively within normal limits
AFI Sum(cm)     %Tile       Largest Pocket(cm)
21.33           84

RUQ(cm)       RLQ(cm)       LUQ(cm)        LLQ(cm)
5.73
Biophysical Evaluation

Amniotic F.V:   Pocket => 2 cm two         F. Tone:        Observed
planes
F. Movement:    Observed                   Score:          [DATE]
F. Breathing:   Observed
Gestational Age

LMP:           30w 1d       Date:   08/12/15                 EDD:   05/18/16
Best:          30w 1d    Det. By:   LMP  (08/12/15)          EDD:   05/18/16
Comments

Clinical picture does not match lab results
Impression

SIUP at 30+1 weeks
Normal amniotic fluid volume
BPP [DATE]
Recommendations

Consider another diagnosis; until then, continue weekly BPPs

## 2018-09-11 ENCOUNTER — Other Ambulatory Visit: Payer: Self-pay

## 2018-09-11 DIAGNOSIS — Z20822 Contact with and (suspected) exposure to covid-19: Secondary | ICD-10-CM

## 2018-09-12 LAB — NOVEL CORONAVIRUS, NAA: SARS-CoV-2, NAA: NOT DETECTED

## 2019-01-09 ENCOUNTER — Other Ambulatory Visit: Payer: Self-pay

## 2019-01-09 DIAGNOSIS — Z20822 Contact with and (suspected) exposure to covid-19: Secondary | ICD-10-CM

## 2019-01-10 LAB — NOVEL CORONAVIRUS, NAA: SARS-CoV-2, NAA: NOT DETECTED

## 2020-06-15 ENCOUNTER — Encounter (HOSPITAL_COMMUNITY): Payer: Self-pay | Admitting: Emergency Medicine

## 2020-06-15 ENCOUNTER — Ambulatory Visit (HOSPITAL_COMMUNITY)
Admission: EM | Admit: 2020-06-15 | Discharge: 2020-06-15 | Disposition: A | Discharge status: Home / Self Care | Payer: Self-pay | Attending: Emergency Medicine | Admitting: Emergency Medicine

## 2020-06-15 ENCOUNTER — Other Ambulatory Visit: Payer: Self-pay

## 2020-06-15 DIAGNOSIS — H8111 Benign paroxysmal vertigo, right ear: Secondary | ICD-10-CM

## 2020-06-15 MED ORDER — MECLIZINE HCL 25 MG PO TABS: 25.0000 mg | ORAL_TABLET | Freq: Three times a day (TID) | ORAL | 0 refills | Status: AC | PRN

## 2020-06-15 MED ORDER — ONDANSETRON 4 MG PO TBDP
4.0000 mg | ORAL_TABLET | Freq: Once | ORAL | Status: AC
  Administered 2020-06-15: 4 mg via ORAL

## 2020-06-15 MED ORDER — ONDANSETRON 4 MG PO TBDP
ORAL_TABLET | ORAL | Status: AC
  Filled 2020-06-15: qty 1

## 2020-06-15 NOTE — Discharge Instructions (Addendum)
The Epley maneuver will fix the problem.  He can look up on YouTube or follow the instructions on the handout that I gave you.  Meclizine will also help with the vertigo.

## 2020-06-15 NOTE — ED Provider Notes (Signed)
HPI  SUBJECTIVE:  Beth Brown is a 40 y.o. female who presents with dizziness described as the "room spinning around me" that lasts minutes accompanied with nausea starting earlier today.  She had a headache, but this has resolved.  No vomiting, ear pain, fevers, tinnitus, chest pain, shortness of breath, syncope, palpitations.  No arm or leg weakness, facial droop, slurred speech, discoordination.  She has never had symptoms like this before.  No aggravating or alleviating factors.  She has not tried anything for this.  She has no past medical history.  LMP: 4/22.  Denies the possibility being pregnant.  PMD: Cannot remember name   Past Medical History:  Diagnosis Date  . Eczema   . GERD (gastroesophageal reflux disease)     Past Surgical History:  Procedure Laterality Date  . NO PAST SURGERIES      Family History  Problem Relation Age of Onset  . Cancer Maternal Aunt     Social History   Tobacco Use  . Smoking status: Never Smoker  . Smokeless tobacco: Never Used  Substance Use Topics  . Alcohol use: Yes    Comment: ocassionally  . Drug use: No    No current facility-administered medications for this encounter.  Current Outpatient Medications:  .  meclizine (ANTIVERT) 25 MG tablet, Take 1 tablet (25 mg total) by mouth 3 (three) times daily as needed for dizziness., Disp: 30 tablet, Rfl: 0 .  norethindrone (MICRONOR,CAMILA,ERRIN) 0.35 MG tablet, Take 1 tablet (0.35 mg total) by mouth daily., Disp: 1 Package, Rfl: 12  Allergies  Allergen Reactions  . Penicillins Hives    Has patient had a PCN reaction causing immediate rash, facial/tongue/throat swelling, SOB or lightheadedness with hypotension: Yes Has patient had a PCN reaction causing severe rash involving mucus membranes or skin necrosis: Yes Has patient had a PCN reaction that required hospitalization No Has patient had a PCN reaction occurring within the last 10 years: No If all of the above answers  are "NO", then may proceed with Cephalosporin use.  . Other     Pt states she is allergic to another antibiotic that she received before giving birth to son but she does not remember the medication.     ROS  As noted in HPI.   Physical Exam  BP 133/75 (BP Location: Right Arm)   Pulse 64   Temp 98.9 F (37.2 C) (Oral)   Resp 16   SpO2 100%   Constitutional: Well developed, well nourished, no acute distress Eyes:  EOMI, conjunctiva normal bilaterally HENT: Normocephalic, atraumatic,mucus membranes moist. TM's normal bilaterally.  Respiratory: Normal inspiratory effort Cardiovascular: Normal rate, regular rhythm, no murmurs rubs or gallops.  No carotid bruit GI: nondistended skin: No rash, skin intact Musculoskeletal: no deformities Neurologic: Alert & oriented x 3, cranial nerves III through XII intact.  Coordination grossly intact, gait steady.  Positive Dix-Hallpike on the right. Psychiatric: Speech and behavior appropriate   ED Course   Medications  ondansetron (ZOFRAN-ODT) disintegrating tablet 4 mg (4 mg Oral Given 06/15/20 1532)    No orders of the defined types were placed in this encounter.   No results found for this or any previous visit (from the past 24 hour(s)). No results found.  ED Clinical Impression  1. Benign paroxysmal positional vertigo of right ear      ED Assessment/Plan  Pt with R sided BBPV.  Epley maneuver on the right, meclizine.  Follow-up with her doctor as needed.  Discussed MDM,  treatment plan, and plan for follow-up with patient. patient agrees with plan.   Meds ordered this encounter  Medications  . ondansetron (ZOFRAN-ODT) disintegrating tablet 4 mg  . meclizine (ANTIVERT) 25 MG tablet    Sig: Take 1 tablet (25 mg total) by mouth 3 (three) times daily as needed for dizziness.    Dispense:  30 tablet    Refill:  0      *This clinic note was created using Scientist, clinical (histocompatibility and immunogenetics). Therefore, there may be occasional  mistakes despite careful proofreading.  ?    Domenick Gong, MD 06/15/20 2016

## 2020-06-15 NOTE — ED Triage Notes (Signed)
Pt presents with headache, dizziness, and nausea that started this am.

## 2020-06-29 ENCOUNTER — Encounter (HOSPITAL_COMMUNITY): Payer: Self-pay

## 2020-06-29 ENCOUNTER — Ambulatory Visit (HOSPITAL_COMMUNITY)
Admission: EM | Admit: 2020-06-29 | Discharge: 2020-06-29 | Disposition: A | Discharge status: Home / Self Care | Payer: Self-pay | Attending: Emergency Medicine | Admitting: Emergency Medicine

## 2020-06-29 DIAGNOSIS — N390 Urinary tract infection, site not specified: Secondary | ICD-10-CM

## 2020-06-29 LAB — POCT URINALYSIS DIPSTICK, ED / UC
Bilirubin Urine: NEGATIVE
Glucose, UA: NEGATIVE mg/dL
Ketones, ur: NEGATIVE mg/dL
Nitrite: NEGATIVE
Protein, ur: 100 mg/dL — AB
Specific Gravity, Urine: >=1.03 (ref 1.005–1.030)
Urobilinogen, UA: 0.2 mg/dL (ref 0.0–1.0)
pH: 6 (ref 5.0–8.0)

## 2020-06-29 LAB — POC URINE PREG, ED: Preg Test, Ur: NEGATIVE

## 2020-06-29 MED ORDER — NITROFURANTOIN MONOHYD MACRO 100 MG PO CAPS: 100.0000 mg | ORAL_CAPSULE | Freq: Two times a day (BID) | ORAL | 0 refills | Status: AC

## 2020-06-29 NOTE — ED Provider Notes (Signed)
[facility]    CSN: [id number] Arrival date & time: 06/29/20  1220      History   Chief Complaint Chief Complaint  Patient presents with  . Dysuria    HPI [facility] Beth Brown is a 40 y.o. female.   Patient here for evaluation of dysuria, urgency and frequency that has been ongoing for the past few days.  Reports having similar symptoms in the past when diagnosed with UTI.  Denies any abdominal or flank pain.  Denies any vaginal pain or discharge.  No concerns about STIs.  Reports trying to drink cranberry juice with similar symptom relief.  Denies any trauma, injury, or other precipitating event.  Denies any specific alleviating or aggravating factors.  Denies any fevers, chest pain, shortness of breath, N/V/D, numbness, tingling, weakness, abdominal pain, or headaches.    The history is provided by the patient.  Dysuria   Past Medical History:  Diagnosis Date  . Eczema   . GERD (gastroesophageal reflux disease)     Patient Active Problem List   Diagnosis Date Noted  . Keratitis 08/01/2012  . Acne 01/08/2011  . DYSTHYMIC DISORDER 08/22/2009  . HEMORRHOIDS, NOS 03/31/2006    Past Surgical History:  Procedure Laterality Date  . NO PAST SURGERIES      OB History    Gravida  4   Para  4   Term  4   Preterm      AB  0   Living  4     SAB      IAB      Ectopic      Multiple  0   Live Births  4            Home Medications    Prior to Admission medications   Medication Sig Start Date End Date Taking? Authorizing Provider  nitrofurantoin, macrocrystal-monohydrate, (MACROBID) 100 MG capsule Take 1 capsule (100 mg total) by mouth 2 (two) times daily. 06/29/20  Yes Ivette Loyal, NP  meclizine (ANTIVERT) 25 MG tablet Take 1 tablet (25 mg total) by mouth 3 (three) times daily as needed for dizziness. 06/15/20   Domenick Gong, MD  norethindrone (MICRONOR,CAMILA,ERRIN) 0.35 MG tablet Take 1 tablet (0.35 mg total) by mouth daily.  07/06/16   Dorathy Kinsman, CNM    Family History Family History  Problem Relation Age of Onset  . Cancer Maternal Aunt     Social History Social History   Tobacco Use  . Smoking status: Never Smoker  . Smokeless tobacco: Never Used  Substance Use Topics  . Alcohol use: Yes    Comment: ocassionally  . Drug use: No     Allergies   Penicillins and Other   Review of Systems Review of Systems  Genitourinary: Positive for dysuria, frequency and urgency.  All other systems reviewed and are negative.    Physical Exam Triage Vital Signs ED Triage Vitals  Enc Vitals Group     BP 06/29/20 1427 130/75     Pulse Rate 06/29/20 1427 (!) 58     Resp 06/29/20 1427 16     Temp 06/29/20 1427 98.7 F (37.1 C)     Temp Source 06/29/20 1427 Oral     SpO2 06/29/20 1427 100 %     Weight --      Height --      Head Circumference --      Peak Flow --      Pain Score 06/29/20 1426 0  Pain Loc --      Pain Edu? --      Excl. in GC? --    No data found.  Updated Vital Signs BP 130/75 (BP Location: Right Arm)   Pulse (!) 58   Temp 98.7 F (37.1 C) (Oral)   Resp 16   LMP 06/28/2020 (Exact Date)   SpO2 100%   Breastfeeding No   Visual Acuity Right Eye Distance:   Left Eye Distance:   Bilateral Distance:    Right Eye Near:   Left Eye Near:    Bilateral Near:     Physical Exam Vitals and nursing note reviewed.  Constitutional:      General: She is not in acute distress.    Appearance: Normal appearance. She is not ill-appearing, toxic-appearing or diaphoretic.  HENT:     Head: Normocephalic and atraumatic.  Eyes:     Conjunctiva/sclera: Conjunctivae normal.  Cardiovascular:     Rate and Rhythm: Normal rate.     Pulses: Normal pulses.  Pulmonary:     Effort: Pulmonary effort is normal.  Abdominal:     General: Abdomen is flat.     Palpations: Abdomen is soft.     Tenderness: There is no right CVA tenderness or left CVA tenderness.  Musculoskeletal:         General: Normal range of motion.     Cervical back: Normal range of motion.  Skin:    General: Skin is warm and dry.  Neurological:     General: No focal deficit present.     Mental Status: She is alert and oriented to person, place, and time.  Psychiatric:        Mood and Affect: Mood normal.      UC Treatments / Results  Labs (all labs ordered are listed, but only abnormal results are displayed) Labs Reviewed  POCT URINALYSIS DIPSTICK, ED / UC - Abnormal; Notable for the following components:      Result Value   Hgb urine dipstick LARGE (*)    Protein, ur 100 (*)    Leukocytes,Ua SMALL (*)    All other components within normal limits  POC URINE PREG, ED    EKG   Radiology No results found.  Procedures Procedures (including critical care time)  Medications Ordered in UC Medications - No data to display  Initial Impression / Assessment and Plan / UC Course  I have reviewed the triage vital signs and the nursing notes.  Pertinent labs & imaging results that were available during my care of the patient were reviewed by me and considered in my medical decision making (see chart for details).    Assessment negative for red flags or concerns.  Urinalysis positive for protein, leukocytes, and hgb.  Will treat with macrobid twice a day for the next 5 days.  Encouraged fluids and rest.  May take AZO or AZO cranberry for symptom relief.  Follow up with primary care as needed.  Final Clinical Impressions(s) / UC Diagnoses   Final diagnoses:  Lower urinary tract infectious disease     Discharge Instructions     Take the Macrobid twice a day for the next 5 days.   Make sure you are drinking plenty of water and rest.   You can take AZO, AZO cranberry for symptoms relief.    Return or go to the Emergency Department if symptoms worsen or do not improve in the next few days.     ED Prescriptions  Medication Sig Dispense Auth. Provider   nitrofurantoin,  macrocrystal-monohydrate, (MACROBID) 100 MG capsule Take 1 capsule (100 mg total) by mouth 2 (two) times daily. 10 capsule Ivette Loyal, NP     PDMP not reviewed this encounter.   Ivette Loyal, NP 06/29/20 1500

## 2020-06-29 NOTE — ED Triage Notes (Signed)
Pt reports pressure and discomfort x 1 day.

## 2020-06-29 NOTE — Discharge Instructions (Addendum)
Take the Macrobid twice a day for the next 5 days.   Make sure you are drinking plenty of water and rest.   You can take AZO, AZO cranberry for symptoms relief.    Return or go to the Emergency Department if symptoms worsen or do not improve in the next few days.

## 2024-03-07 ENCOUNTER — Other Ambulatory Visit: Payer: Self-pay | Admitting: Obstetrics and Gynecology

## 2024-03-07 DIAGNOSIS — Z1231 Encounter for screening mammogram for malignant neoplasm of breast: Secondary | ICD-10-CM

## 2024-05-17 ENCOUNTER — Ambulatory Visit: Payer: Self-pay
